# Patient Record
Sex: Female | Born: 1945 | Race: White | Hispanic: No
Health system: Midwestern US, Community
[De-identification: ages and names within clinical notes are randomized; demographics above are authoritative.]

## PROBLEM LIST (undated history)

## (undated) DIAGNOSIS — R011 Cardiac murmur, unspecified: Secondary | ICD-10-CM

## (undated) DIAGNOSIS — E119 Type 2 diabetes mellitus without complications: Secondary | ICD-10-CM

## (undated) DIAGNOSIS — F32A Depression, unspecified: Secondary | ICD-10-CM

## (undated) DIAGNOSIS — I10 Essential (primary) hypertension: Secondary | ICD-10-CM

## (undated) DIAGNOSIS — F419 Anxiety disorder, unspecified: Secondary | ICD-10-CM

## (undated) DIAGNOSIS — F329 Major depressive disorder, single episode, unspecified: Secondary | ICD-10-CM

## (undated) HISTORY — PX: ABDOMINAL HYSTERECTOMY: SHX81

## (undated) HISTORY — PX: BACK SURGERY: SHX140

---

## 2014-01-16 NOTE — Procedures (Signed)
[facility]                         [facility]                      [facility]                              Echocardiogram      Patient Name:     [name], [name]        Date of Service:  12/25/2013  Medical Record #: [id number]            Admission Date:   12/25/2013  Account #:        [id number]           Admitting:  Date of Birth:    February 13, 1946             Attending:  Age:              24                     Hospital Service:  Referring  Physician:  Dictating         Lily Lovings,  Physician:        MD                                   Electronically Authenticated                          Lily Lovings, MD 01/17/2014 09:30 A      [id number] 2D ECHO Complete  TECHNICIAN:  JC  Height:  65 inches      Weight:  190 pounds  Gender:  F        Blood Pressure:  152/80  BSA:  1.94  REASON FOR TEST:     Murmur, abnormal EKG.  PATIENT HISTORY:     Hypertension, diabetes mellitus.  QUALITY OF TEST:     Not given.  CHAMBER DIMENSIONS  LVEDD:      3.6           (up to 5.5)  LVESD:      2.1      (up to 3.5)  V Septum:   1.3      (up to 1.1)  PW LV:   1.3         (up to 1.1)  Ao Root: 2.9  LA diam: 2.8      (up to 4.0)  RVEDD:   3.1      (up to 2.4)  RA area: 14 cm  LA area: 18 cm   (<20)  LAVI:    29 mL/m (22+ 6)  FINDINGS:  Ejection fraction:   69%.  Right ventricle:     Normal.  Left atrium:         Normal.  Right atrium:     Normal.  Aorta:            Normal.  Precardium:    No pericardial effusion.  Aortic valve:     Trace aortic regurgitation.  Mitral valve:              Normal.  Tricuspid valve:      Normal.  Pulmonic valve:  Normal.  IMPRESSION:  1.  Normal left ventricular systolic function.  Ejection fraction  69%.    2.  Mild left ventricular hypertrophy.    3.  Probably trace aortic regurgitation.    4.  Clinical correlation is recommended.  There is no significant  aortic stenosis.                  DOD: 01/16/2014  12:43 P   HK/clm  DOT: 01/16/2014 03:25 P  Job Number:  Document Number: [id number]  cc:

## 2015-04-02 NOTE — Other (Unsigned)
Patient Acct Nbr: [id number]   Primary AUTH/CERT:   Primary Insurance Company Name: [employer]  Primary Insurance Plan name: Medicare A  Primary Insurance Group Number:   Primary Insurance Plan Type: [employer] A  Primary Insurance Policy Number: [id number]    Secondary AUTH/CERT:   Secondary Insurance Company Name: [employer]  Secondary Insurance Plan name: Medicare B  Secondary Insurance Group Number:   Secondary Insurance Plan Type: [employer] B  Secondary Insurance Policy Number: [id number]    Tertiary AUTH/CERT:   [employer]iary Insurance Company Name: [employer]  Tertiary Insurance Plan name: [employer]  Tertiary Insurance Group Number:   [employer]iary Insurance Plan Type: [employer]  Tertiary Insurance Policy Number: [id number]

## 2015-04-11 LAB — FACTOR 5 LEIDEN

## 2015-11-28 ENCOUNTER — Emergency Department: Payer: Medicare (Managed Care)

## 2015-11-28 ENCOUNTER — Emergency Department
Admission: EM | Admit: 2015-11-28 | Discharge: 2015-11-29 | Disposition: A | Discharge status: Home / Self Care | Payer: Medicare (Managed Care) | Attending: Emergency Medicine | Admitting: Emergency Medicine

## 2015-11-28 ENCOUNTER — Encounter: Payer: Self-pay | Admitting: Emergency Medicine

## 2015-11-28 DIAGNOSIS — I10 Essential (primary) hypertension: Secondary | ICD-10-CM | POA: Insufficient documentation

## 2015-11-28 DIAGNOSIS — S42291A Other displaced fracture of upper end of right humerus, initial encounter for closed fracture: Secondary | ICD-10-CM | POA: Insufficient documentation

## 2015-11-28 DIAGNOSIS — W010XXA Fall on same level from slipping, tripping and stumbling without subsequent striking against object, initial encounter: Secondary | ICD-10-CM | POA: Insufficient documentation

## 2015-11-28 DIAGNOSIS — Y999 Unspecified external cause status: Secondary | ICD-10-CM | POA: Insufficient documentation

## 2015-11-28 DIAGNOSIS — Y939 Activity, unspecified: Secondary | ICD-10-CM | POA: Insufficient documentation

## 2015-11-28 DIAGNOSIS — S4290XA Fracture of unspecified shoulder girdle, part unspecified, initial encounter for closed fracture: Secondary | ICD-10-CM

## 2015-11-28 DIAGNOSIS — S0990XA Unspecified injury of head, initial encounter: Secondary | ICD-10-CM | POA: Diagnosis present

## 2015-11-28 DIAGNOSIS — E119 Type 2 diabetes mellitus without complications: Secondary | ICD-10-CM | POA: Insufficient documentation

## 2015-11-28 DIAGNOSIS — Y929 Unspecified place or not applicable: Secondary | ICD-10-CM | POA: Diagnosis not present

## 2015-11-28 DIAGNOSIS — F329 Major depressive disorder, single episode, unspecified: Secondary | ICD-10-CM | POA: Diagnosis not present

## 2015-11-28 HISTORY — DX: Major depressive disorder, single episode, unspecified: F32.9

## 2015-11-28 HISTORY — DX: Type 2 diabetes mellitus without complications: E11.9

## 2015-11-28 HISTORY — DX: Anxiety disorder, unspecified: F41.9

## 2015-11-28 HISTORY — DX: Cardiac murmur, unspecified: R01.1

## 2015-11-28 HISTORY — DX: Depression, unspecified: F32.A

## 2015-11-28 HISTORY — DX: Essential (primary) hypertension: I10

## 2015-11-28 MED ORDER — MORPHINE SULFATE (PF) 4 MG/ML IV SOLN
INTRAVENOUS | Status: AC
  Administered 2015-11-28: 4 mg via INTRAVENOUS
  Filled 2015-11-28: qty 1

## 2015-11-28 MED ORDER — ONDANSETRON HCL 4 MG/2ML IJ SOLN
4.0000 mg | Freq: Once | INTRAMUSCULAR | Status: AC
  Administered 2015-11-28: 4 mg via INTRAVENOUS

## 2015-11-28 MED ORDER — OXYCODONE-ACETAMINOPHEN 5-325 MG PO TABS: 1.0000 | ORAL_TABLET | Freq: Four times a day (QID) | ORAL | Status: AC | PRN

## 2015-11-28 MED ORDER — MORPHINE SULFATE (PF) 4 MG/ML IV SOLN
4.0000 mg | Freq: Once | INTRAVENOUS | Status: AC
  Administered 2015-11-28: 4 mg via INTRAVENOUS

## 2015-11-28 MED ORDER — ONDANSETRON HCL 4 MG/2ML IJ SOLN
INTRAMUSCULAR | Status: AC
  Administered 2015-11-28: 4 mg via INTRAVENOUS
  Filled 2015-11-28: qty 2

## 2015-11-28 NOTE — Discharge Instructions (Signed)
How to Use a Shoulder Immobilizer A shoulder immobilizer is a device that you may have to wear after a shoulder injury or surgery. This device keeps your arm from moving. This prevents additional pain or injury. It also supports your arm next to your body as your shoulder heals. You may need to wear a shoulder immobilizer to treat a broken bone (fracture) in your shoulder. You may also need to wear one if you have an injury that moves your shoulder out of position (dislocation). There are different types of shoulder immobilizers. The one that you get depends on your injury. RISKS AND COMPLICATIONS Wearing a shoulder immobilizer in the wrong way can let your injured shoulder move around too much. This may delay healing and make your pain and swelling worse. HOW TO USE YOUR SHOULDER IMMOBILIZER  The part of the immobilizer that goes around your neck (sling) should support your upper arm, with your elbow bent and your lower arm and hand across your chest.  Make sure that your elbow:  Is snug against the back pocket of the sling.  Does not move away from your body.  The strap of the immobilizer should go over your shoulder and support your arm and hand. Your hand should be slightly higher than your elbow. It should not hang loosely over the edge of the sling.  If the long strap has a pad, place it where it is most comfortable on your neck.  Carefully follow your health care provider's instructions for wearing your shoulder immobilizer. Your health care provider may want you to:  Loosen your immobilizer to straighten your elbow and move your wrist and fingers. You may have to do this several times each day. Ask your health care provider when you should do this and how often.  Remove your immobilizer once every day to shower, but limit the movement in your injured arm. Before putting the immobilizer back on, use a towel to dry the area under your arm completely.  Remove your immobilizer to do  shoulder exercises at home as directed by your health care provider.  Wear your immobilizer while you sleep. You may sleep more comfortably if you have your upper body raised on pillows. SEEK MEDICAL CARE IF:  Your immobilizer is not supporting your arm properly.  Your immobilizer gets damaged.  You have worsening pain or swelling in your shoulder, arm, or hand.  Your shoulder, arm, or hand changes color or temperature.  You lose feeling in your shoulder, arm, or hand.   This information is not intended to replace advice given to you by your health care provider. Make sure you discuss any questions you have with your health care provider.   Document Released: 06/18/2004 Document Revised: 09/25/2014 Document Reviewed: 04/18/2014 Elsevier Interactive Patient Education [employer]2016 Elsevier Inc. Call Dr. Ernest PineHooten in the morning. He should get you an appointment to see him in about a week in the office. Use the Percocet 1-2 pills 4 times a day if needed for pain. If you don't need it don't use it. He cannot drive on the Percocet. Also be careful to make you woozy and constipated. Wear the shoulder immobilizer.

## 2015-11-28 NOTE — ED Provider Notes (Signed)
[facility] Emergency Department Provider Note   ____________________________________________  Time seen: Approximately 10:59 PM  I have reviewed the triage vital signs and the nursing notes.   HISTORY  Chief Complaint Fall    HPI Lawson RadarDoris Banfill is a 70 y.o. female who reports she stepped on a dog and then try not to fall but ended up falling anyway landed on her elbow and shoulder. Also hit her head. She complains of pain in the neck medial scapula area shoulder and little bit of pain in the elbow. She also has some pain in the right CVA area over the ribs. The fall just happened she has no other injuries and she can walk without any difficulty and bend her elbow without any difficulty. Pain is moderately severe.   Past Medical History  Diagnosis Date  . Hypertension   . Diabetes mellitus without complication (HCC)   . Heart murmur   . Mild anxiety   . Depression     There are no active problems to display for this patient.   Past Surgical History  Procedure Laterality Date  . Abdominal hysterectomy    . Back surgery      Current Outpatient Rx  Name  Route  Sig  Dispense  Refill  . oxyCODONE-acetaminophen (ROXICET) 5-325 MG tablet   Oral   Take 1 tablet by mouth every 6 (six) hours as needed.   20 tablet   0     Allergies Eggs or egg-derived products  No family history on file.  Social History Social History  Substance Use Topics  . Smoking status: Never Smoker   . Smokeless tobacco: None  . Alcohol Use: No    Review of Systems Constitutional: No fever/chills Eyes: No visual changes. ENT: No sore throat. Cardiovascular: Denies chest pain. Respiratory: Denies shortness of breath. Gastrointestinal: No abdominal pain.  No nausea, no vomiting.  No diarrhea.  No constipation. Genitourinary: Negative for dysuria. Musculoskeletal: Negative for back pain. Skin: Negative for rash. Neurological: Negative for headaches, focal  weakness or numbness.  10-point ROS otherwise negative.  ____________________________________________   PHYSICAL EXAM:  VITAL SIGNS: ED Triage Vitals  Enc Vitals Group     BP 11/28/15 2145 161/67 mmHg     Pulse Rate 11/28/15 2145 71     Resp 11/28/15 2145 18     Temp 11/28/15 2145 98.2 F (36.8 C)     Temp Source 11/28/15 2145 Oral     SpO2 11/28/15 2145 97 %     Weight 11/28/15 2145 200 lb (90.719 kg)     Height 11/28/15 2145 5\' 5"  (1.651 m)     Head Cir --      Peak Flow --      Pain Score 11/28/15 2159 10     Pain Loc --      Pain Edu? --      Excl. in GC? --     Constitutional: Alert and oriented. Well appearing and inModerate distress Eyes: Conjunctivae are normal. PERRL. EOMI. Head: Atraumatic. Nose: No congestion/rhinnorhea. Mouth/Throat: Mucous membranes are moist.  Oropharynx non-erythematous. Neck: No stridor.  Neck is mildly tender even in the midline Cardiovascular: Normal rate, regular rhythm. Grossly normal heart sounds.  Good peripheral circulation. Respiratory: Normal respiratory effort.  No retractions. Lungs CTAB. Gastrointestinal: Soft and nontender. No distention. No abdominal bruits. Right CVA tenderness. Musculoskeletal: No lower extremity tenderness nor edema.  No joint effusions. Patient has a swollen area in the medial right scapula and  in the deltoid both of these are tender. Patient has good range of motion of the elbow. Neurologic:  Normal speech and language. No gross focal neurologic deficits are appreciated. No gait instability. Skin:  Skin is warm, dry and intact. No rash noted. Psychiatric: Mood and affect are normal. Speech and behavior are normal.  ____________________________________________   LABS (all labs ordered are listed, but only abnormal results are displayed)  Labs Reviewed  URINALYSIS COMPLETEWITH MICROSCOPIC ([facility] ONLY)    ____________________________________________  EKG   ____________________________________________  RADIOLOGY  X-rays shows a fracture of the head of the humerus with minimal displacement. ____________________________________________   PROCEDURES    Procedures    ____________________________________________   INITIAL IMPRESSION / ASSESSMENT AND PLAN / ED COURSE  Pertinent labs & imaging results that were available during my care of the patient were reviewed by me and considered in my medical decision making (see chart for details).   ____________________________________________   FINAL CLINICAL IMPRESSION(S) / ED DIAGNOSES  Final diagnoses:  Fractured shoulder, unspecified laterality, closed, initial encounter      NEW MEDICATIONS STARTED DURING THIS VISIT:  New Prescriptions   OXYCODONE-ACETAMINOPHEN (ROXICET) 5-325 MG TABLET    Take 1 tablet by mouth every 6 (six) hours as needed.     Note:  This document was prepared using Dragon voice recognition software and may include unintentional dictation errors.    Arnaldo NatalPaul F Ledford Goodson, MD 11/29/15 Marlyne Beards0002

## 2015-11-28 NOTE — ED Notes (Signed)
Patient presents to ED with c/o tripping over her dog and fell, pt c/o RIGHT shoulder pain, R hip pain, R knee pain, and R elbow pain. Pt also reports hit the back of head but denies LOC. Pt reports pain increased with movement, states unable to move RIGHT shoulder. Pt reports is on daily 81 mg Aspirin. Pt alert and oriented x 4, speaking in complete sentences, no increased work in breathing noted. Abrasion noted to RIGHT elbow and RIGHT knee.

## 2015-11-29 DIAGNOSIS — S42291A Other displaced fracture of upper end of right humerus, initial encounter for closed fracture: Secondary | ICD-10-CM | POA: Diagnosis not present

## 2015-11-29 MED ORDER — OXYCODONE-ACETAMINOPHEN 5-325 MG PO TABS
1.0000 | ORAL_TABLET | Freq: Once | ORAL | Status: AC
  Administered 2015-11-29: 1 via ORAL
  Filled 2015-11-29: qty 1

## 2016-03-31 NOTE — Other (Unsigned)
Patient Acct Nbr: [id number]   Primary AUTH/CERT:   Primary Insurance Company Name: [employer] [place]  Primary Insurance Plan name: [facility] [employer] Advantage  Primary Insurance Group Number: [id number]  Primary Insurance Plan Type: Health  Primary Insurance Policy Number: [id number]

## 2016-04-02 ENCOUNTER — Inpatient Hospital Stay: Attending: Family | Primary: Family Medicine

## 2016-06-27 ENCOUNTER — Inpatient Hospital Stay
Admit: 2016-06-27 | Discharge: 2016-06-27 | Disposition: A | Discharge status: Other Acute Inpatient Hospital | Admitting: Internal Medicine

## 2016-06-27 DIAGNOSIS — I4891 Unspecified atrial fibrillation: Principal | ICD-10-CM

## 2016-06-27 LAB — CBC WITH AUTO DIFFERENTIAL
Absolute Baso #: 0 10*3/uL (ref 0.0–0.2)
Absolute Eos #: 0.2 10*3/uL (ref 0.0–0.5)
Absolute Lymph #: 2.8 10*3/uL (ref 1.0–4.3)
Absolute Mono #: 0.9 10*3/uL — ABNORMAL HIGH (ref 0.0–0.8)
Absolute Neut #: 4.8 10*3/uL (ref 1.8–7.0)
Basophils: 0.2 % (ref 0.0–2.0)
Eosinophils: 2 % (ref 1.0–6.0)
Granulocytes %: 55.7 % (ref 40.0–80.0)
Hematocrit: 43.5 % (ref 35.0–47.0)
Hemoglobin: 14.5 g/dL (ref 11.7–16.0)
Lymphocyte %: 32 % (ref 20.0–40.0)
MCH: 29.5 pg (ref 26.0–34.0)
MCHC: 33.3 % (ref 32.0–36.0)
MCV: 88.6 fL (ref 79.0–98.0)
MPV: 8.7 fL (ref 7.4–10.4)
Monocytes: 10.1 % — ABNORMAL HIGH (ref 2.0–10.0)
Platelets: 179 10*3/uL (ref 140–440)
RBC: 4.91 10*6/uL (ref 3.80–5.20)
RDW: 14.9 % — ABNORMAL HIGH (ref 11.5–14.5)
WBC: 8.6 10*3/uL (ref 3.6–10.7)

## 2016-06-27 LAB — BASIC METABOLIC PANEL
Anion Gap: 7 NA
BUN: 10 mg/dL (ref 7–25)
CO2: 27 mmol/L (ref 21–32)
Calcium: 8.5 mg/dL (ref 8.2–10.1)
Chloride: 104 mmol/L (ref 98–109)
Creatinine: 0.85 mg/dL (ref 0.55–1.40)
EGFR IF NonAfrican American: >60 mL/min (ref >60)
Glucose: 255 mg/dL — ABNORMAL HIGH (ref 70–100)
Potassium: 3.6 mmol/L (ref 3.5–5.1)
Sodium: 138 mmol/L (ref 135–145)
eGFR African American: >60 mL/min (ref >60)

## 2016-06-27 LAB — BRAIN NATRIURETIC PEPTIDE: NT Pro-BNP: 60 pg/mL (ref 0–125)

## 2016-06-27 LAB — MAGNESIUM: Magnesium: 1.9 mg/dL (ref 1.8–2.4)

## 2016-06-27 LAB — TROPONIN: Troponin I: <0.015 ng/mL (ref 0.000–0.045)

## 2016-06-27 MED ORDER — NORMAL SALINE FLUSH 0.9 % IV SOLN
0.9 % | Freq: Three times a day (TID) | INTRAVENOUS | Status: DC
Start: 2016-06-27 — End: 2016-06-27

## 2016-06-27 MED ORDER — KETOROLAC TROMETHAMINE 30 MG/ML IJ SOLN
30 MG/ML | Freq: Once | INTRAMUSCULAR | Status: AC
Start: 2016-06-27 — End: 2016-06-27
  Administered 2016-06-27: 12:00:00 15 mg via INTRAVENOUS

## 2016-06-27 MED ORDER — DILTIAZEM HCL 25 MG/5ML IV SOLN
25 MG/5ML | Freq: Once | INTRAVENOUS | Status: AC
Start: 2016-06-27 — End: 2016-06-27
  Administered 2016-06-27: 12:00:00 20 mg via INTRAVENOUS

## 2016-06-27 MED ORDER — TETANUS-DIPHTH-ACELL PERTUSSIS 5-2.5-18.5 LF-MCG/0.5 IM SUSP
Freq: Once | INTRAMUSCULAR | Status: AC
Start: 2016-06-27 — End: 2016-06-27
  Administered 2016-06-27: 11:00:00 0.5 mL via INTRAMUSCULAR

## 2016-06-27 MED ORDER — DEXTROSE 5 % IV SOLN
5 % | INTRAVENOUS | Status: DC
Start: 2016-06-27 — End: 2016-06-27
  Administered 2016-06-27: 12:00:00 5 mg/h via INTRAVENOUS

## 2016-06-27 MED FILL — KETOROLAC TROMETHAMINE 30 MG/ML IJ SOLN: 30 MG/ML | INTRAMUSCULAR | Qty: 1

## 2016-06-27 MED FILL — DILTIAZEM HCL 25 MG/5ML IV SOLN: 25 MG/5ML | INTRAVENOUS | Qty: 5

## 2016-06-27 MED FILL — BOOSTRIX 5-2.5-18.5 IM SUSP: INTRAMUSCULAR | Qty: 0.5

## 2016-06-27 MED FILL — DILTIAZEM HCL 125 MG/25ML IV SOLN: 125 MG/25ML | INTRAVENOUS | Qty: 25

## 2016-06-27 NOTE — ED Notes (Signed)
Spoke with Alyssa at [facility] states squad should be arriving any minute now.      Forestine ChuteSteven L Rivers Hamrick, RN  06/27/16 1316

## 2016-06-27 NOTE — ED Notes (Signed)
Normal sinus rhythm verified by ekg. Doctor states to stop cardizem gtt at this time     [name], RN  06/27/16 [phone]

## 2016-06-27 NOTE — ED Provider Notes (Signed)
[facility] ED  eMERGENCY dEPARTMENT eNCOUnter        Pt Name: [name]  MRN: [id number]  Birthdate 19-Feb-1946  Date of evaluation: 06/27/2016  Provider: Blaine Hamper, MD  PCP: Darden Dates, MD  ED Attending: Blaine Hamper, MD        History of Present Illness     Patient Identification  [name] is a 71 y.o. female.    Patient information was obtained from patient.  History/Exam limitations: none.    Chief Complaint   Fall (fell out of bed)      Critical Care Time:  35 minutes excluding separately reported procedures.      History of Present Illness:    71 year old female presents after she fell out of bed sustaining a head injury with scalp laceration to her RIGHT occipital scalp. She is complaining of moderately severe pain at the site of the injury. No neck pain. She denies any loss of consciousness. She does take baby aspirin daily but no other blood thinners. She has no nausea or vomiting. No focal weakness or numbness. On arrival, the patient was found to be in rapid atrial fibrillation. She has no history of previous atrial fibrillation, coronary disease.  She has no history of overactive thyroid, sleep apnea. No use of stimulants. She denies any chest pain shortness of breath or palpitations. She is not lightheaded. Her last tetanus is unknown and will need to be updated. She has no neck or back pain. She denies any extremity injury. After the fall, she was able to get up and walk to the bathroom.    Past Medical History:   Diagnosis Date   ??? Anxiety    ??? Depression    ??? Diabetes mellitus (HCC)    ??? Heart murmur    ??? Hypertension      History reviewed. No pertinent surgical history.  History reviewed. No pertinent family history.  Current Facility-Administered Medications   Medication Dose Route Frequency Provider Last Rate Last Dose   ??? sodium chloride flush 0.9 % injection 3 mL  3 mL Intravenous Q8H Blaine Hamper, MD       ??? diltiazem 125 mg in dextrose 5 % 125 mL infusion   5 mg/hr Intravenous Continuous Blaine Hamper, MD 5 mL/hr at 06/27/16 0645 5 mg/hr at 06/27/16 0645     Current Outpatient Prescriptions   Medication Sig Dispense Refill   ??? insulin glargine (LANTUS) 100 UNIT/ML injection vial Inject into the skin nightly     ??? gabapentin (NEURONTIN) 100 MG capsule Take 100 mg by mouth 3 times daily.     ??? insulin lispro (HUMALOG) 100 UNIT/ML injection vial Inject into the skin 3 times daily (before meals)     ??? glimepiride (AMARYL) 2 MG tablet Take 2 mg by mouth every morning (before breakfast)     ??? SITagliptin (JANUVIA) 100 MG tablet Take 50 mg by mouth daily     ??? lovastatin (MEVACOR) 20 MG tablet Take 20 mg by mouth nightly     ??? levothyroxine (SYNTHROID) 112 MCG tablet Take 112 mcg by mouth Daily     ??? amLODIPine (NORVASC) 5 MG tablet Take 5 mg by mouth daily     ??? sertraline (ZOLOFT) 100 MG tablet Take 100 mg by mouth daily     ??? cyclobenzaprine (FLEXERIL) 10 MG tablet Take 10 mg by mouth 3 times daily as needed for Muscle spasms       No Known  Allergies  Social History     Social History   ??? Marital status: Divorced     Spouse name: N/A   ??? Number of children: N/A   ??? Years of education: N/A     Occupational History   ??? Not on file.     Social History Main Topics   ??? Smoking status: Never Smoker   ??? Smokeless tobacco: Never Used   ??? Alcohol use No   ??? Drug use: No   ??? Sexual activity: Not on file     Other Topics Concern   ??? Not on file     Social History Narrative   ??? No narrative on file         Review of Systems  Denies fever, neck pain, chest pain, short of breath, cough, abdominal pain, urinary symptoms.  Remainder of systems reviewed and negative.      Physical Exam     Vitals:    Vitals:    06/27/16 0603   BP: (!) 184/102   Pulse: 144   Resp: 20   Temp: 98.8 ??F (37.1 ??C)   TempSrc: Oral   SpO2: 98%     Nursing notes and vitals signs reviewed and I agree.    General Appearance:    Alert, cooperative, Mild painful but no respiratory distress, appears stated age.    Head:    Normocephalic. There is a 2 cm laceration to the RIGHT occipital scalp. No bony crepitus or step-off. No significant hematoma. No Battle's or raccoon sign.    Eyes:    PERRL, conjunctiva/corneas clear, EOM's intact.  Sclera anicteric.  No hyphema or subconjunctival hemorrhage.    ENT:   Mucous membranes moist.     Neck:   Supple, symmetrical, trachea midline, no adenopathy.  No jugular venous distention.  There is no cervical thoracic or lumbar spine tenderness. Full range of motion.    Lungs:     Clear to auscultation bilaterally, respirations unlabored.  No rales, rhonchi or wheezes.   Chest Wall:    No tenderness.    Heart:    Tachycardic with an irregularly irregular rhythm, S1 and S2 normal, no murmur, rub or gallop.   Abdomen:     Soft, non-tender, bowel sounds active all four quadrants,     no masses, no organomegaly.  No cva tenderness.     Extremities:   No edema, cords or calf tenderness.  Full range of motion.  There is no hip or pelvic tenderness. The hips and pelvis are stable on compression and rocking. No extremity tenderness.    Pulses:   2+ and symmetric all extremities.   Skin:   Turgor is normal, no rashes or lesions.   Neurologic:   Alert and oriented X 3.  No focal findings. GCS is 15. Cranial nerves II through IV are intact. Motor strength is normal and equal in all extremities. Sensation is intact to light touch. Reflexes revealed intact knee jerk responses. Gait was not tested but there is no ataxia.        ED Course     Medical Decision Making:    Differential diagnosis includes Head injury with scalp laceration on aspirin, consider concussion subdural or epidural hematoma. Sitter potential for spinal injury. There is no evidence for spinal cord injury. She is in rapid atrial fibrillation. Consider ischemic heart disease, valvular heart disease, electrolyte disturbance. Treatment options include rate control or cardioversion.    DIAGNOSTIC RESULTS     EKG:  interpreted by me in the  absence of a real-time interpretation by the cardiologist.  Atrial relation with rapid ventricular response at a rate of 145, LEFT axis deviation, normal intervals, LEFT ventricular hypertrophy with repolarization changes, nonspecific ST-T wave changes. Likely rate related ST depressions in the lateral leads versus LVH related changes. No ventricular ectopy.    RADIOLOGY   Interpretation per the Radiologist below, and visualized by me:  Xr Chest Portable    Result Date: 06/27/2016  Patient Name:  KERRY-ANNE, MEZO MRN:  [id number] FIN:  [id number] ---Diagnostic Radiology--- Exam Date/Time        06/27/2016 06:35:00 EST                             Exam                  CR Chest Portable                                   Ordering Physician    Hulda Marin, MD, Yamaris Cummings B                             Accession Number      [id number]                                       CPT4 Codes 57846 () Reason For Exam rapid afib, fall Report PORTABLE CHEST CLINICAL INDICATION:  Fall. Atrial fibrillation. COMPARISON: 08/09/2006 TECHNIQUE: Portable upright AP chest.  FINDINGS: The cardiac and mediastinal silhouette is normal. Coarse bilateral interstitial markings. No focal consolidation, pneumothorax or pleural effusion. The hila are normal. Thoracic degenerative spondylosis. IMPRESSION:  No acute cardiopulmonary abnormality. Report Dictated on Workstation: [id number] ---** Final ---** Dictating Physician: Shane Crutch DO, DAVID I Signed Date and Time: 06/27/2016 7:02 am Signed by: Shane Crutch DO, DAVID I Transcribed Date and Time: 06/27/2016 7:03    CT scan of head and cervical spine interpreted by me in the absence of a radiologist, pending their official report: There is no acute intracranial bleed, no mass or mass effect or midline shift. There is a stapled scalp wound present. No hematoma. Cervical spine CT shows no acute fracture or dislocation. No soft tissue swelling. Degenerative changes are noted.      LABS:  Labs Reviewed   BASIC  METABOLIC PANEL - Abnormal; Notable for the following:        Result Value    Glucose 255 (*)     All other components within normal limits    Narrative:     Test Performed by [facility], [address],  [place], [place] [address]   CBC WITH AUTO DIFFERENTIAL - Abnormal; Notable for the following:     RDW 14.9 (*)     Monocytes 10.1 (*)     Absolute Mono # 0.9 (*)     All other components within normal limits    Narrative:     Test Performed by [facility], [address]  [place], [place] [address]   BRAIN NATRIURETIC PEPTIDE    Narrative:     Test Performed by [facility], [address]  [place], [place] [address]   MAGNESIUM    Narrative:  Test Performed by [facility], [address],  [place], [place] [id number]   TROPONIN    Narrative:     Test Performed by [facility], [address],  [place], [place] [id number]          Procedures:  Wound cleansed with tap water and then scrubbed. The wound length is actually 2.7 cm. Wound was closed with skin staples ?? 5 with good closure obtained. Did not need any local anesthesia. She tolerated this well.      Vitals:    Vitals:    06/27/16 0603   BP: (!) 184/102   Pulse: 144   Resp: 20   Temp: 98.8 ??F (37.1 ??C)   TempSrc: Oral   SpO2: 98%       Treatments:  On arrival the patient is hemodynamically stable.  However she is in rapid atrial fibrillation. She is hypertensive on arrival. Cardizem 20 mg bolus followed by 5 mg per hour drip initiated. Tetanus status updated.  Toradol IV given for pain.  6:40 AM: After Cardizem bolus, heart rate is under good control with rate between 95 and 110.  Labs showed no major abnormalities. Troponin is negative and BMP is normal. Blood sugar is elevated at 255. No other electrolyte abnormalities noted.    Medications   sodium chloride flush 0.9 % injection 3 mL (not administered)   diltiazem 125 mg in dextrose 5 % 125 mL infusion (5 mg/hr Intravenous New Bag 06/27/16 0645)   Tetanus-Diphth-Acell  Pertussis (BOOSTRIX) injection 0.5 mL (0.5 mLs Intramuscular Given 06/27/16 0628)   diltiazem injection 20 mg (20 mg Intravenous Given 06/27/16 0631)   ketorolac (TORADOL) injection 15 mg (15 mg Intravenous Given 06/27/16 0705)       MDM  Number of Diagnoses or Management Options  Atrial fibrillation with rapid ventricular response (HCC):   Fall from bed, initial encounter:   Laceration of scalp, initial encounter:   .        DISPOSITION/PLAN   Disposition:  Admitted.      FINAL IMPRESSION      1. Atrial fibrillation with rapid ventricular response (HCC)    2. Laceration of scalp, initial encounter    3. Fall from bed, initial encounter            Blaine HamperJeffrey B Randee Huston, MD (electronically signed)  Emergency Medicine Provider        Please note that this chart was generated using dragon dictation software.  Although every effort was made to ensure the accuracy of this automated transcription, some errors in transcription may have occurred.       Blaine HamperJeffrey B Rupa Lagan, MD  06/27/16 [phone]

## 2016-06-27 NOTE — ED Notes (Signed)
[facility] states eta is now [address]     Forestine Chute, RN  06/27/16 1217

## 2016-06-27 NOTE — ED Notes (Signed)
Cleaned wound. Dr at bedside to put staples in.     Myrtie Hawkenee E Kamin Niblack, RN  06/27/16 [phone]

## 2016-06-27 NOTE — ED Notes (Signed)
Pt requesting pain meds. Dr informed.     Myrtie Hawkenee E Sweden Lesure, RN  06/27/16 [phone]

## 2016-06-27 NOTE — ED Notes (Signed)
[facility], [facility] called for transport, ETA reported to be 1045.     Bonnita Hollowebra L Tekesha Almgren, RN  06/27/16 1014

## 2016-06-27 NOTE — ED Notes (Addendum)
Pt presented to Ed by EMS from a fall. Pt fell out of bed and hit her head on her night stand. Pt has a small laceration to the back of the right side of head. Pt is A/O X3.  Pt has easy unlabored respirations. Pt has equal and reactive pupils. Pt has a black and blue discoloration to right side of eye. Pt is moving all extremities. Pt has normal active bowel sounds in all 4 quads. Pt ambulatory. Pt is resting in bed.     Myrtie Hawk, RN  06/27/16 1610       Myrtie Hawk, RN  06/27/16 [phone]

## 2016-06-27 NOTE — ED Notes (Signed)
Spoke with supervisor Deretha Emoryhambers at [facility], assures me they will send squad ASAP     Forestine ChuteSteven L Kortlynn Poust, RN  06/27/16 1316

## 2016-06-27 NOTE — ED Provider Notes (Signed)
I received signout from Dr. Verne Spurr in regards to the patient at approximately at approximate 720 a.m.  Plan was for the patient to be admitted to this hospital for atrial fibrillation patient was on a Cardizem drip.  Patient does not have a history of atrial fibrillation that we are aware of.  Spoke with care manager Wilford Corner at 819 a.m.  due to patient's insurance of medical mutual transferred to [facility]. Recommended.  I initially spoke with transfer center at [facility]. Allie 842 a.m. who took report on the patient.  Patient requested to go to [facility]. however [facility] not [facility] any beds.  Patient is aware comfortable with transport to [facility].  Patient did have improvement of heart rate and repeat electrocardiogram shows a sinus rhythm at 81 with no acute ST-T elevation.  Patient was accepted by Dr. Hyacinth Meeker.  Patient will be transferred to [facility]. for further workup and treatment of new onset paroxysmal atrial fibrillation.     Jobe Igo, MD  06/27/16 1052

## 2016-06-27 NOTE — Other (Unsigned)
Patient Acct Nbr: [id number]   Primary AUTH/CERT:   Primary Insurance Company Name: [employer]  Primary Insurance Plan name: [facility] Advantage  Primary Insurance Group Number: [id number]  Primary Insurance Plan Type: Health  Primary Insurance Policy Number: [id number]    Secondary AUTH/CERT:   Secondary Insurance Company Name: [facility]  Secondary Insurance Plan name: Medicaid  Secondary Insurance Group Number:   Secondary Insurance Plan Type: Health  Secondary Insurance Policy Number: [id number]

## 2016-06-27 NOTE — ED Notes (Signed)
Bed control reports [facility] open [facility] at [facility], pt states  " I guess well have [facility]". Dr. Everlena Cooperadford aware     Julian Reilebecca Janera Peugh, RN  06/27/16 [phone]

## 2016-06-27 NOTE — ED Notes (Signed)
Effort of standing and pivoting to use BSC, HR noted to increase to 131, patient denies SOB, CP.     Bonnita Hollow, RN  06/27/16 [phone]

## 2016-06-27 NOTE — ED Notes (Signed)
Pt and family requesting admit to [facility], states Dr., Warnell Bureau in with Dr, Dutch Quint and Dr. Dutch Quint sees pts at [facility]. Dr. Everlena Cooper notified of pt and family request.      [name], RN  06/27/16 [phone]

## 2016-06-27 NOTE — Care Coordination-Inpatient (Addendum)
2/3   70 YR OLD WF, DIVORCED, LIVES ALONE, S/P FALL, LACERATION HEAD, FOUND TO BE IN AFIB/RVR, NEW ONSET.  PT IS OUT OF NETWORK, HAS [employer] MEDICARE ADVANTAGE.  TALKED TO ER PHYSICIAN AND TOLD HIM HE HAS TO CALL [facility] TO SEE IF THERE IS A BED AVAILABLE AND IF SO NEEDS TRANSFERRED TO THAT FACILITY.  MMISHLER RN X [phone]    2/3    FAMILY HAS CHOSEN [facility] THAN [facility], [facility] IS IN NETWORK W. [employer] MEDICARE ADVANTAGE.  ER ARRANGING TRANSFER  MMISHLER RN X [phone]

## 2016-06-27 NOTE — ED Notes (Signed)
[facility] called to follow up on patient transfer time, advised that transport now scheduled for noon. Patient updated on change.     Bonnita Hollowebra L Joycelin Radloff, RN  06/27/16 1105

## 2016-06-27 NOTE — ED Notes (Signed)
Pt and family aware of bed assignment now [facility]     Julian ReilRebecca Shalonda Sachse, [place]  06/27/16 [phone]

## 2016-06-27 NOTE — ED Notes (Signed)
Noted normal sinus rhythm on monitor, Dr. Eather ColasAware , will repeat ekg to verify, If back in nsr dr. Andrey CotaStates to d/c cardizem gtt     Julian Reilebecca Delmos Velaquez, RN  06/27/16 0930

## 2016-07-23 ENCOUNTER — Encounter: Attending: Cardiovascular Disease | Primary: Family Medicine

## 2017-02-04 IMAGING — CT CT CERVICAL SPINE W/O CM
4 of 7 series · 13 of 33 positions shown, 14 images · non-contrast
Comparison: None.

CLINICAL DATA: Tripped over dog and fell tonight striking the back
of the head.

EXAM:
CT HEAD WITHOUT CONTRAST
CT CERVICAL SPINE WITHOUT CONTRAST
TECHNIQUE: Multidetector CT imaging of the head and cervical spine was
performed following the standard protocol without intravenous
contrast. Multiplanar CT image reconstructions of the cervical spine
were also generated.

[Series 5: soft tissue · axial · 0.37mm/px · z∈[-322,-214]mm · 4 of 91 slices shown]
[im 19/91  soft-tissue]
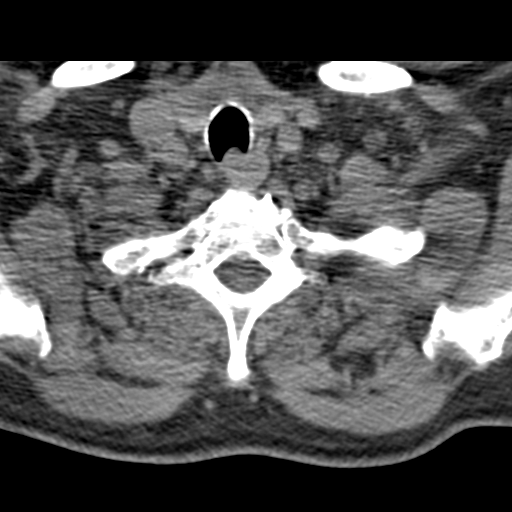
[im 37/91  soft-tissue]
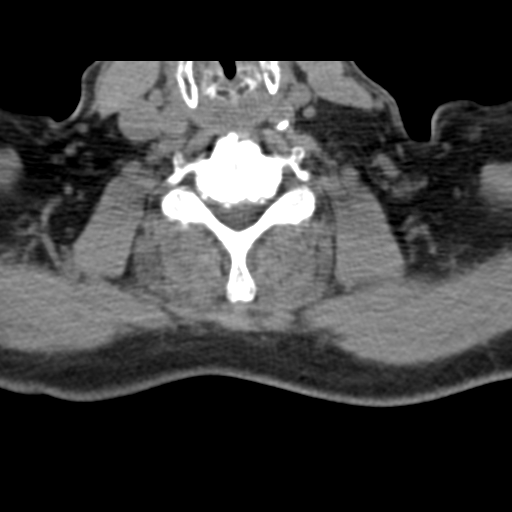
[im 55/91  soft-tissue]
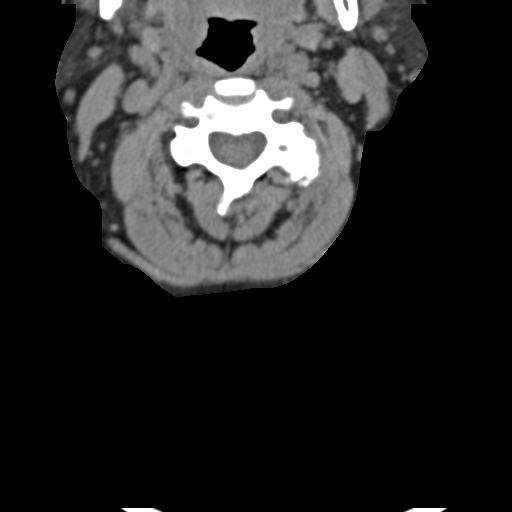
[im 73/91  soft-tissue]
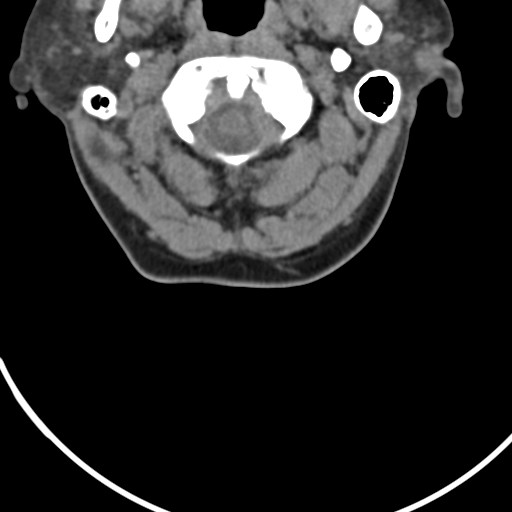

[Series 8: sagittal bone · sagittal · 0.28mm/px · 4 of 41 slices shown]
[im 9/41  bone]
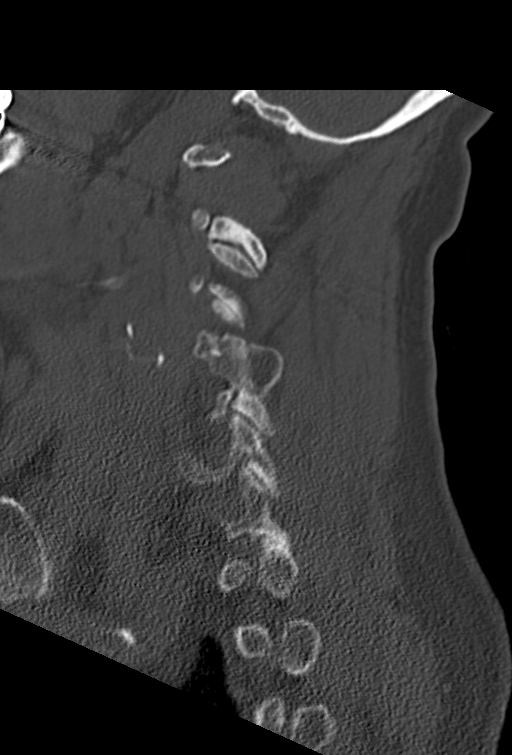
[im 17/41  bone]
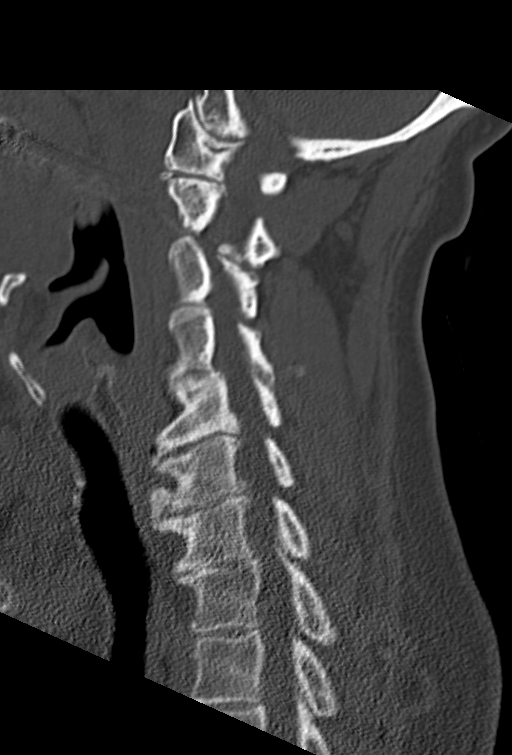
[im 25/41  bone]
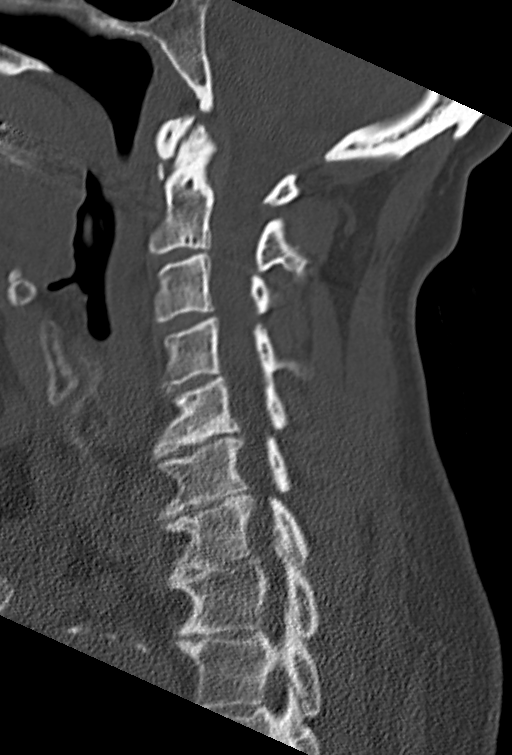
[im 33/41  bone]
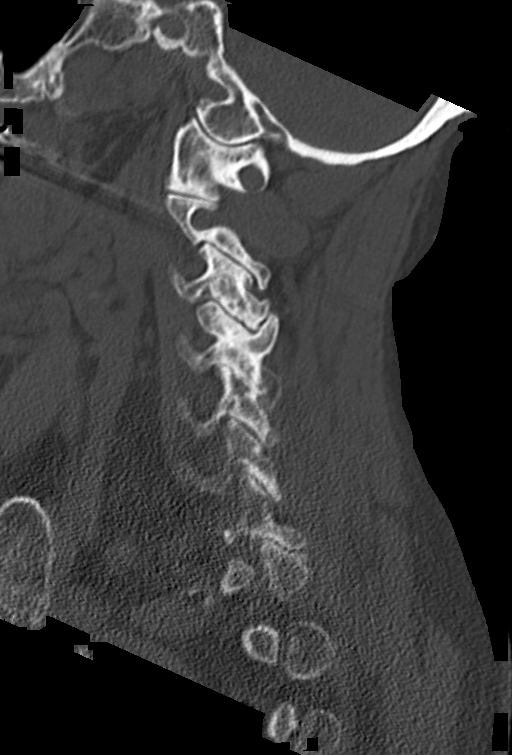

[Series 9: coronal bone · coronal · 0.23mm/px · 1 of 49 slices shown]
[im 25/49  bone]
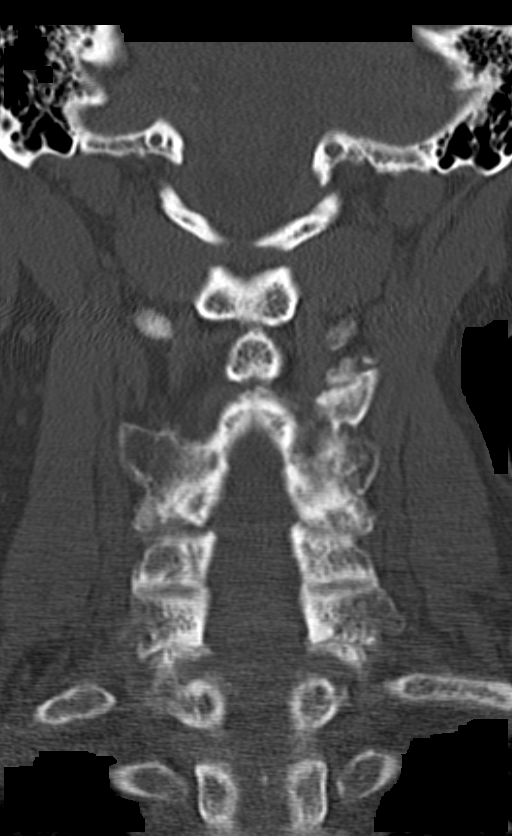

[Series 10: axial · axial · 0.31mm/px · z∈[-353,-244]mm · 4 of 102 slices shown, 5 images]
[im 21/102  soft-tissue]
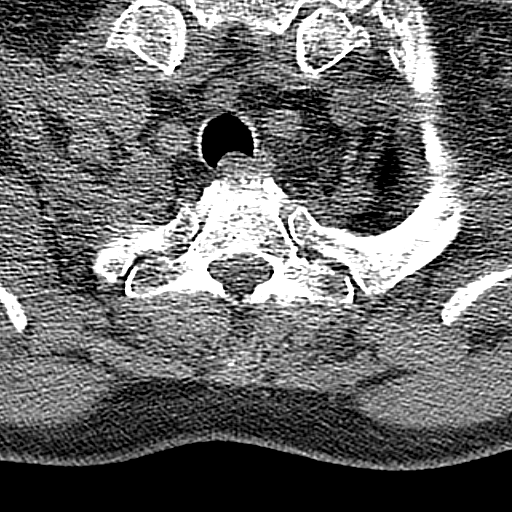
[im 21/102  bone]
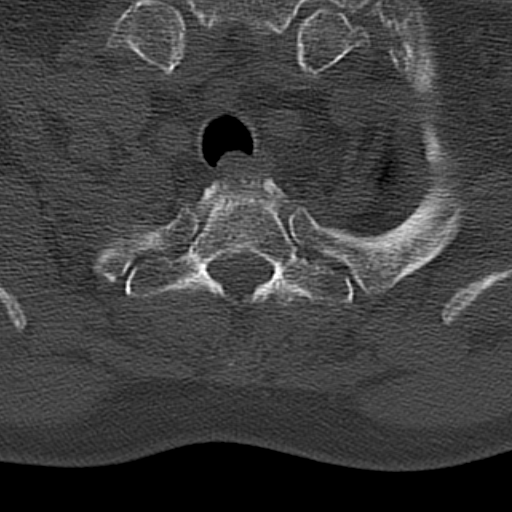
[im 41/102  bone]
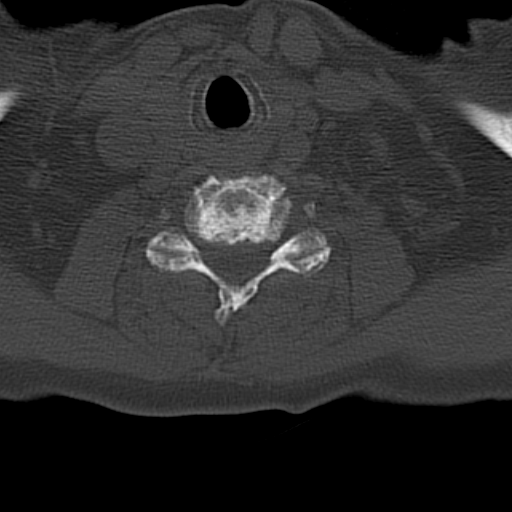
[im 61/102  bone]
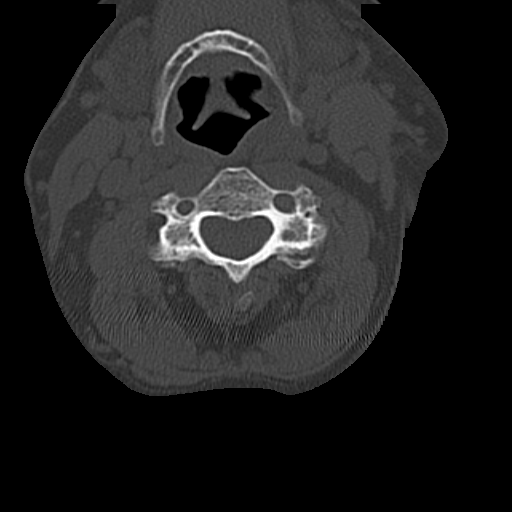
[im 81/102  bone]
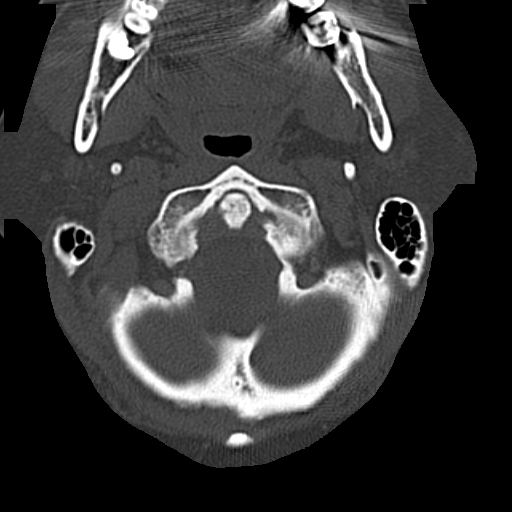

[13 of 33 positions shown; findings below may reference images not displayed]

FINDINGS: CT HEAD FINDINGS

The brain has a normal appearance without evidence of malformation,
atrophy, old or acute infarction, mass lesion, hemorrhage,
hydrocephalus or extra-axial collection. The calvarium is
unremarkable. The paranasal sinuses, middle ears and mastoids are
clear.

CT CERVICAL SPINE FINDINGS

No fracture or malalignment. No soft tissue swelling. There is facet
arthropathy on the right at C2-3 and C3-4. There is facet fusion at
C4-5. Mild facet degeneration also at C7-T1. On the left, there are
similar findings with degeneration at C2-3, C3-4 and C7-T1 and
fusion at C4-5. Degenerative spondylosis at C5-6 and C6-7 with
endplate osteophytes and foraminal encroachment due to osteophytes.
IMPRESSION: Head CT:  Normal.

Cervical spine CT: No acute or traumatic finding. Chronic
degenerative facet arthropathy and degenerative spondylosis.

## 2019-05-09 ENCOUNTER — Inpatient Hospital Stay: Attending: Family Medicine | Primary: Family Medicine

## 2019-05-09 NOTE — Other (Unsigned)
Patient Acct Nbr: [id number]   Primary AUTH/CERT:   Primary Insurance Company Name: Company secretary Plan name: [employer] [facility] Medicare  Primary Insurance Group Number: [id number]  Primary Insurance Plan Type: Health  Primary Insurance Policy Number: [id number]

## 2019-06-12 ENCOUNTER — Ambulatory Visit: Admit: 2019-06-12 | Discharge: 2019-06-12 | Payer: MEDICARE | Attending: Vascular Surgery | Primary: Family Medicine

## 2019-06-12 DIAGNOSIS — I779 Disorder of arteries and arterioles, unspecified: Secondary | ICD-10-CM

## 2019-08-01 ENCOUNTER — Inpatient Hospital Stay: Admit: 2019-08-01 | Attending: Family Medicine | Primary: Family Medicine

## 2019-08-01 DIAGNOSIS — R0600 Dyspnea, unspecified: Secondary | ICD-10-CM

## 2019-08-01 LAB — NM MYOCARDIAL SPECT REST EXERCISE OR RX: Left Ventricular Ejection Fraction: 74

## 2019-08-01 MED ORDER — REGADENOSON 0.4 MG/5ML IV SOLN
0.4 MG/5ML | Freq: Once | INTRAVENOUS | Status: AC | PRN
Start: 2019-08-01 — End: 2019-08-01
  Administered 2019-08-01: 19:00:00 0.4 mg via INTRAVENOUS

## 2019-08-01 MED ORDER — SODIUM CHLORIDE 0.9 % IV SOLN
0.9 % | INTRAVENOUS | Status: AC | PRN
Start: 2019-08-01 — End: 2019-08-01

## 2019-08-01 MED ORDER — ATROPINE SULFATE 1 MG/10ML IJ SOSY
1 MG/0ML | INTRAMUSCULAR | Status: AC | PRN
Start: 2019-08-01 — End: 2019-08-01

## 2019-08-01 MED ORDER — AMINOPHYLLINE 25 MG/ML IV SOLN
25 MG/ML | INTRAVENOUS | Status: AC | PRN
Start: 2019-08-01 — End: 2019-08-01

## 2019-08-01 MED ORDER — NORMAL SALINE FLUSH 0.9 % IV SOLN
0.9 % | INTRAVENOUS | Status: AC | PRN
Start: 2019-08-01 — End: 2019-08-01
  Administered 2019-08-01: 19:00:00 10 mL via INTRAVENOUS

## 2019-08-01 MED ORDER — ALBUTEROL SULFATE HFA 108 (90 BASE) MCG/ACT IN AERS
108 (90 Base) MCG/ACT | RESPIRATORY_TRACT | Status: AC | PRN
Start: 2019-08-01 — End: 2019-08-01

## 2019-08-01 MED ORDER — METOPROLOL TARTRATE 5 MG/5ML IV SOLN
5 MG/ML | INTRAVENOUS | Status: AC | PRN
Start: 2019-08-01 — End: 2019-08-01

## 2019-08-01 MED ORDER — NITROGLYCERIN 0.4 MG SL SUBL
0.4 MG | SUBLINGUAL | Status: AC | PRN
Start: 2019-08-01 — End: 2019-08-01

## 2019-08-01 NOTE — Patient Instructions (Signed)
Left message with patient regarding pre procedure instructions for nuclear stress test on 08-01-2019 at 1245, including no caffeine products for 12 hours prior to test.  Call back number provided for any questions.

## 2019-08-15 ENCOUNTER — Inpatient Hospital Stay: Admit: 2019-08-15 | Attending: Family Medicine | Primary: Family Medicine

## 2020-07-10 LAB — SURGICAL PATHOLOGY

## 2020-07-22 NOTE — Telephone Encounter (Signed)
Spoke with patient. Reviewed instructions for procedure.  Pt aware of arrival time at 6:30 am to [facility] at [facility] on 07/24/20. Come in the main entrance and proceed to the [place] elevators. Take the [facility] elevator up to 2R and this is where the [facility] department is located. Nothing to eat after midnight, you may drink clear liquids (water, black coffee, clear tea, apple juice, cranberry juice and/or carbonated beverages) and take medications up until 6 am and you will need a responsible person to drive you home after procedure. Pt takes Eliquis and may continue to take prior to procedure. Questions answered. Pt verbalized understanding.

## 2020-07-24 ENCOUNTER — Encounter: Admit: 2020-07-24 | Primary: Family Medicine

## 2020-07-24 LAB — MANUAL DIFFERENTIAL
Absolute Baso #: 6 10*3/uL — ABNORMAL HIGH (ref 0.0–0.2)
Absolute Eos #: 1 10*3/uL — ABNORMAL HIGH (ref 0.0–0.5)
Absolute Lymph #: 6 10*3/uL — ABNORMAL HIGH (ref 1.1–4.5)
Absolute Mono #: 4 10*3/uL — ABNORMAL HIGH (ref 0.2–1.1)
Absolute Neut #: 68.2 10*3/uL — ABNORMAL HIGH (ref 2.2–8.2)
Bands: 20 % — ABNORMAL HIGH (ref 0–3)
Basophils: 6 % — ABNORMAL HIGH (ref 0–2)
Blasts: 1 % — AB (ref <1)
Eosinophils: 1 % (ref 1–6)
Lymphocytes: 6 % — ABNORMAL LOW (ref 20–40)
Metamyelocytes: 5 % — AB (ref <1)
Monocytes: 4 % (ref 2–10)
Myelocytes: 6 % — AB (ref <1)
Promyelocytes: 3 % — AB (ref <1)
RBC Morphology: ABNORMAL NA
Seg Neutrophils: 48 % (ref 40–80)
TOTAL CELLS COUNTED: 100 NA
nRBC: 1 /100{WBCs} — ABNORMAL HIGH (ref <=0)

## 2020-07-24 LAB — CBC WITH AUTO DIFFERENTIAL
Hematocrit: 36.4 % (ref 35.0–47.0)
Hemoglobin: 11.5 g/dL — ABNORMAL LOW (ref 11.7–16.0)
MCH: 28.7 pg (ref 26.0–34.0)
MCHC: 31.6 % — ABNORMAL LOW (ref 32.0–36.0)
MCV: 90.8 fL (ref 79.0–98.0)
MPV: 9.7 fL (ref 7.4–10.4)
Platelets: 201 10*3/uL (ref 140–440)
RBC: 4.01 10*6/uL (ref 3.80–5.20)
RDW: 18.6 % — ABNORMAL HIGH (ref 11.5–14.5)
WBC: 100.3 10*3/uL (ref 3.6–10.7)

## 2020-07-24 LAB — PROTIME-INR
INR: 1.2 NA — ABNORMAL HIGH (ref 0.9–1.1)
Protime: 13 s — ABNORMAL HIGH (ref 9.0–12.0)

## 2020-07-24 MED ORDER — MIDAZOLAM HCL 2 MG/2ML IJ SOLN
2 | INTRAMUSCULAR | Status: AC
Start: 2020-07-24 — End: 2020-07-24

## 2020-07-24 MED ORDER — LIDOCAINE HCL (PF) 2 % IJ SOLN
2 | INTRAMUSCULAR | Status: AC
Start: 2020-07-24 — End: 2020-07-24

## 2020-07-24 MED ORDER — SODIUM CHLORIDE 0.9 % IV SOLN
0.9 % | INTRAVENOUS | Status: DC
Start: 2020-07-24 — End: 2020-07-25

## 2020-07-24 MED ORDER — MIDAZOLAM HCL 2 MG/2ML IJ SOLN
2 MG/ML | Freq: Once | INTRAMUSCULAR | Status: AC | PRN
Start: 2020-07-24 — End: 2020-07-24
  Administered 2020-07-24: 14:00:00 via INTRAVENOUS

## 2020-07-24 MED ORDER — FENTANYL CITRATE (PF) 100 MCG/2ML IJ SOLN
100 | INTRAMUSCULAR | Status: AC
Start: 2020-07-24 — End: 2020-07-24

## 2020-07-24 MED ORDER — ACETAMINOPHEN 325 MG PO TABS
325 MG | ORAL | Status: DC | PRN
Start: 2020-07-24 — End: 2020-07-25

## 2020-07-24 MED ORDER — LIDOCAINE HCL (PF) 1 % IJ SOLN
1 % | Freq: Once | INTRAMUSCULAR | Status: AC | PRN
Start: 2020-07-24 — End: 2020-07-24
  Administered 2020-07-24: 14:00:00 via INTRADERMAL

## 2020-07-24 MED ORDER — FENTANYL CITRATE (PF) 100 MCG/2ML IJ SOLN
100 MCG/2ML | Freq: Once | INTRAMUSCULAR | Status: AC | PRN
Start: 2020-07-24 — End: 2020-07-24
  Administered 2020-07-24: 14:00:00 50 via INTRAVENOUS

## 2020-07-24 MED FILL — MIDAZOLAM HCL 2 MG/2ML IJ SOLN: 2 mg/mL | INTRAMUSCULAR | Qty: 2

## 2020-07-24 MED FILL — FENTANYL CITRATE (PF) 100 MCG/2ML IJ SOLN: 100 MCG/2ML | INTRAMUSCULAR | Qty: 2

## 2020-07-24 MED FILL — XYLOCAINE-MPF 2 % IJ SOLN: 2 % | INTRAMUSCULAR | Qty: 10

## 2020-07-24 NOTE — Other (Signed)
Patient arrived from [facility], Dr. Monte Fantasia in to speak with the patient regarding ct bone biopsy, consent obtained. Patient was placed prone on exam table prepped and draped in sterile fashion. Telemetry monitors placed, conscious sedation administered. Patient tolerated procedure well. Transfer to SDS

## 2020-07-24 NOTE — Other (Unsigned)
Patient Acct Nbr: [id number]   Primary AUTH/CERT:   Primary Insurance Company Name: Company secretary Plan name: [employer] [facility] Medicare  Primary Insurance Group Number: [id number]  Primary Insurance Plan Type: Health  Primary Insurance Policy Number: [id number]

## 2020-07-24 NOTE — H&P (Signed)
[facility]   Comprehensive PreProcedure History and Physical      Name: Renie Stelmach MRN: [id number] DOB: 09/24/1945    (Age-75 y.o.)     Date of Service: Pt seen/examined on 07/24/2020     Chief Complaint:  75 y.o. female who we are asked to see/evaluate Beverely Pace for pre-procedure evaluation prior to CT BIOPSY BONE IVR.    History Of Present Illness:      HPI:  This pleasant 75 yo woman has a hx of abnormal WBC's and bruising. She now presents for Bone Marrow Bx. Severity: Moderate Duration: >one month         Review of systems negative except for items below:  Past Medical History:   Diagnosis Date   ??? Anxiety    ??? Depression    ??? Diabetes mellitus (HCC)    ??? Heart murmur    ??? Hyperlipidemia    ??? Hypertension    ??? Thyroid disease      Patient Active Problem List    Diagnosis Date Noted   ??? Rapid atrial fibrillation (HCC) 06/27/2016         Past Surgical History:   Procedure Laterality Date   ??? BACK SURGERY     ??? HYSTERECTOMY           Family History   Problem Relation Age of Onset   ??? Stroke Mother    ??? Diabetes Father    ??? Heart Disease Father          Medications:   CURRENT MEDS W/ ASSOC DIAG     Med List Status: Complete Set By: Beryle Beams, RN at 07/24/2020  6:59 AM            Start Date End Date     amLODIPine (NORVASC) 5 MG tablet  --  --     Associated Diagnoses:  --     apixaban (ELIQUIS) 5 MG TABS tablet  --  --     Associated Diagnoses:  --     atorvastatin (LIPITOR) 80 MG tablet  04/22/19  --     Associated Diagnoses:  --     cyclobenzaprine (FLEXERIL) 10 MG tablet  --  --     Associated Diagnoses:  --     fluticasone (FLONASE) 50 MCG/ACT nasal spray  05/02/19  --     Associated Diagnoses:  --     gabapentin (NEURONTIN) 100 MG capsule  --  --     Associated Diagnoses:  --     glimepiride (AMARYL) 2 MG tablet  --  --     Associated Diagnoses:  --     insulin lispro (HUMALOG) 100 UNIT/ML injection vial  --  --     Associated Diagnoses:  --     levothyroxine (SYNTHROID) 112 MCG  tablet  --  --     Associated Diagnoses:  --     lisinopril (PRINIVIL;ZESTRIL) 20 MG tablet  --  --     Associated Diagnoses:  --     metoprolol tartrate (LOPRESSOR) 25 MG tablet  --  --     Associated Diagnoses:  --     Semaglutide,0.25 or 0.5MG /DOS, (OZEMPIC, 0.25 OR 0.5 MG/DOSE,) 2 MG/1.5ML SOPN  --  --     Associated Diagnoses:  --     sertraline (ZOLOFT) 100 MG tablet  --  --     Associated Diagnoses:  --     SITagliptin (JANUVIA) 100 MG tablet  --  --  Associated Diagnoses:  --     TRESIBA FLEXTOUCH 100 UNIT/ML SOPN  04/25/19  --     Associated Diagnoses:  --             Flag for Provider Review         Start Date End Date     insulin glargine (LANTUS) 100 UNIT/ML injection vial  --  --     Associated Diagnoses:  --          Social history and Laboratory Data:    Lab Results   Component Value Date/Time    HGB 14.5 06/27/2016 06:24 AM    HCT 43.5 06/27/2016 06:24 AM    PLT 179 06/27/2016 06:24 AM    WBC 8.6 06/27/2016 06:24 AM    NA 138 06/27/2016 06:24 AM    K 3.6 06/27/2016 06:24 AM    BUN 10 06/27/2016 06:24 AM    CREATININE 0.85 06/27/2016 06:24 AM    GLUCOSE 255 (H) 06/27/2016 06:24 AM    Social History     Tobacco Use   Smoking Status Never Smoker   Smokeless Tobacco Never Used      Social History     Substance and Sexual Activity   Alcohol Use No     Social History     Substance and Sexual Activity   Drug Use No        EKG: Reviewed       No results for input(s): GLUCOSE in the last 72 hours. No results found for this or any previous visit.             BP (!) 165/67    Pulse 67    Temp 98.9 ??F (37.2 ??C) (Temporal)    Resp 16    SpO2 97%         Physical Examination:  onstitutional: No apparent distress, well nourished and in stable condition. Pt wearing a mask and I wore a mask.  Performed a "no touch" examination due to Covid considerations.    Cardiac: Regular rate and rhythm Per PT Abdomen: Soft and nonacute Per Pt   Pulmonary: Clear bilaterally and no wheezing Per pt Neuro: Moves extremities X4  with no tremors   HEENT: No gross cranial nerve defects and anicteric sclera Skin: Skin warm and dry with no visible rashes   Vascular: Adequate perfusion of extremities with no cyanosis Psych: Alert and oriented x3 with appropriate affect   Lymphatics: No swelling of arms/hands with no pedal edema Neck: FROM with no JVD   Additional Notes:None    After review of the medical history and physical assessment, medications, allergies, patient's current medical condition, and labs, this patient is at acceptable risk for the planned procedure at this facility.      Electronically signed by: Nyra Jabs, MD, MD Date: 07/24/2020 at 7:12 AM

## 2020-08-12 LAB — SURGICAL PATHOLOGY

## 2020-08-13 NOTE — Other (Unsigned)
Patient Acct Nbr: [id number]   Primary AUTH/CERT:   Primary Insurance Company Name: Company secretary Plan name: [facility] Medicare  Primary Insurance Group Number: [id number]  Primary Insurance Plan Type: Health  Primary Insurance Policy Number: [id number]

## 2020-08-16 ENCOUNTER — Inpatient Hospital Stay: Admit: 2020-08-16 | Discharge: 2020-08-19 | Disposition: A | Discharge status: Home Health | Admitting: Family Medicine

## 2020-08-16 DIAGNOSIS — T148XXA Other injury of unspecified body region, initial encounter: Secondary | ICD-10-CM

## 2020-08-16 LAB — COMPREHENSIVE METABOLIC PANEL
ALT: 43 U/L — ABNORMAL HIGH (ref 0–34)
AST: 52 U/L — ABNORMAL HIGH (ref 15–46)
Albumin,Serum: 3.6 g/dL (ref 3.5–5.0)
Alkaline Phosphatase: 158 U/L — ABNORMAL HIGH (ref 38–126)
Anion Gap: 10 mmol/L (ref 3–13)
BUN: 22 mg/dL — ABNORMAL HIGH (ref 9–20)
CO2: 20 mmol/L — ABNORMAL LOW (ref 22–30)
Calcium: 8.7 mg/dL (ref 8.4–10.4)
Chloride: 102 mmol/L (ref 98–107)
Creatinine: 0.91 mg/dL (ref 0.52–1.25)
EGFR IF NonAfrican American: 62 mL/min (ref >60)
Glucose: 523 mg/dL (ref 70–100)
Potassium: 4.2 mmol/L (ref 3.5–5.1)
Sodium: 132 mmol/L — ABNORMAL LOW (ref 135–145)
Total Bilirubin: 1.1 mg/dL (ref 0.2–1.3)
Total Protein: 7.3 g/dL (ref 6.3–8.2)
eGFR African American: 71.9 mL/min (ref >60)

## 2020-08-16 LAB — URINALYSIS
Bilirubin Urine: NEGATIVE mg/dL
Glucose, Ur: >1000 mg/dL — AB (ref <70)
Ketones, Urine: NEGATIVE mg/dL
LEUKOCYTES, UA: NEGATIVE Leu/uL
Nitrite, Urine: NEGATIVE NA
Occult Blood,Urine: 0.2 mg/dL — AB
Specific Gravity, Urine: >1.03 NA — AB (ref 1.005–1.030)
Total Protein, Urine: 30 mg/dL — AB
Urobilinogen, Urine: NORMAL mg/dL (ref 0–1)
pH, Urine: 5 NA (ref 5.0–8.0)

## 2020-08-16 LAB — CBC WITH AUTO DIFFERENTIAL
Hematocrit: 34.7 % — ABNORMAL LOW (ref 35.0–47.0)
Hemoglobin: 10.8 g/dL — ABNORMAL LOW (ref 11.7–16.0)
MCH: 28.3 pg (ref 26.0–34.0)
MCHC: 31 % — ABNORMAL LOW (ref 32.0–36.0)
MCV: 91.1 fL (ref 79.0–98.0)
MPV: 9.9 fL (ref 7.4–10.4)
Platelets: 195 10*3/uL (ref 140–440)
RBC: 3.81 10*6/uL (ref 3.80–5.20)
RDW: 18.8 % — ABNORMAL HIGH (ref 11.5–14.5)
WBC: 126.3 10*3/uL (ref 3.6–10.7)

## 2020-08-16 LAB — MANUAL DIFFERENTIAL
Absolute Baso #: 3.8 10*3/uL — ABNORMAL HIGH (ref 0.0–0.2)
Absolute Eos #: 1.3 10*3/uL — ABNORMAL HIGH (ref 0.0–0.5)
Absolute Lymph #: 7.6 10*3/uL — ABNORMAL HIGH (ref 1.1–4.5)
Absolute Mono #: 3.8 10*3/uL — ABNORMAL HIGH (ref 0.2–1.1)
Absolute Neut #: 87.1 10*3/uL — ABNORMAL HIGH (ref 2.2–8.2)
Bands: 11 % — ABNORMAL HIGH (ref 0–3)
Basophils: 3 % — ABNORMAL HIGH (ref 0–2)
Blasts: 2 % — AB (ref <1)
Eosinophils: 1 % (ref 1–6)
Lymphocytes: 6 % — ABNORMAL LOW (ref 20–40)
Metamyelocytes: 7 % — AB (ref <1)
Monocytes: 3 % (ref 2–10)
Myelocytes: 6 % — AB (ref <1)
Promyelocytes: 3 % — AB (ref <1)
RBC Morphology: ABNORMAL NA
Seg Neutrophils: 58 % (ref 40–80)
TOTAL CELLS COUNTED: 100 NA

## 2020-08-16 LAB — TROPONIN: Troponin I: 0.019 ng/mL (ref 0.000–0.034)

## 2020-08-16 LAB — PROTIME/INR & PTT
INR: 1.2 NA — ABNORMAL HIGH (ref 0.9–1.1)
Protime: 13 s — ABNORMAL HIGH (ref 9.0–12.0)
aPTT: 37.3 s — ABNORMAL HIGH (ref 20.0–30.5)

## 2020-08-16 LAB — MAGNESIUM: Magnesium: 1.7 mg/dL (ref 1.6–2.3)

## 2020-08-16 LAB — CK: Total CK: 151 U/L (ref 30–170)

## 2020-08-16 LAB — POCT GLUCOSE: POC Glucose: 361 mg/dL — ABNORMAL HIGH (ref 70–100)

## 2020-08-16 MED ORDER — INSULIN REGULAR HUMAN 100 UNIT/ML IJ SOLN
100 UNIT/ML | Freq: Once | INTRAMUSCULAR | Status: AC
Start: 2020-08-16 — End: 2020-08-16
  Administered 2020-08-16: 19:00:00 via SUBCUTANEOUS

## 2020-08-16 MED ORDER — SODIUM CHLORIDE 0.9 % IV BOLUS
0.9 | Freq: Once | INTRAVENOUS | Status: AC
Start: 2020-08-16 — End: 2020-08-16
  Administered 2020-08-16: 18:00:00 via INTRAVENOUS

## 2020-08-16 MED ORDER — OXYCODONE HCL 5 MG PO TABS
5 MG | ORAL | Status: DC | PRN
Start: 2020-08-16 — End: 2020-08-19
  Administered 2020-08-16 – 2020-08-19 (×8): 5 mg via ORAL

## 2020-08-16 MED ORDER — NORMAL SALINE FLUSH 0.9 % IV SOLN
0.9 | Freq: Three times a day (TID) | INTRAVENOUS | Status: DC
Start: 2020-08-16 — End: 2020-08-16

## 2020-08-16 MED FILL — OXYCODONE HCL 5 MG PO TABS: 5 MG | ORAL | Qty: 1

## 2020-08-16 NOTE — Progress Notes (Signed)
Arrived from ER, via cart, to 150

## 2020-08-16 NOTE — ED Provider Notes (Signed)
[place] [facility]  eMERGENCY dEPARTMENT eNCOUnter      Pt Name: [name]  MRN: [id number]  [place] 03/16/1946  Date of evaluation: 08/16/2020  Provider: Barbette Reichmann, APRN - CNP   This patient was seen in conjunction with Dr. Rejeana Brock    CHIEF COMPLAINT       Chief Complaint   Patient presents with   ??? Fatigue     Pt sent over from [facility] by Dr. Awanda Mink. Pt with multiple bruises over body, arms, knee and chin. Pt states has been feeling weak and fell yesterday. Pt is on eloquis.         HISTORY OF PRESENT ILLNESS   (Location/Symptom, Timing/Onset,Context/Setting, Quality, Duration, Modifying Factors, Severity) Note limiting factors.   HPI    [name] is a 75 y.o. female who presents to the emergency department with bruises all over her body.  Patient was sent over by Dr. Bartholome Bill, she is on Eliquis because of a history of atrial fibrillation.  She recently was diagnosed with leukemia.  Patient states she fell in the yard 2 days ago, she states she is absolutely positive that she did not hit her head.  She has bruising around the left eye to the left chin.  She also has bruising to the right inner elbow.  She complains of pain in the right hip and thigh.  She states she has some bruising on her abdomen.  States she was recently started on a medicine for her leukemia.  She denies any other complaints.  Dr. Bartholome Bill was concerned because he felt the patient was off, for me she is alert and oriented x4 can answer questions appropriately, her affect was offered Dr. Bartholome Bill but I am unclear as to her normal affect this is the first time I evaluated the patient.  Nursing Notes were reviewed.    REVIEW OF SYSTEMS    (2+ for4; 10+ for level 5)     Review of Systems   Constitutional: Negative for activity change, appetite change, chills and fever.   HENT: Negative for congestion, ear discharge, ear pain, hearing loss, postnasal drip, rhinorrhea and sore throat.    Eyes: Negative for discharge and redness.   Respiratory:  Negative for cough, chest tightness, shortness of breath and wheezing.    Cardiovascular: Negative for chest pain and palpitations.   Gastrointestinal: Negative for abdominal pain, diarrhea, nausea and vomiting.   Genitourinary: Negative for dysuria.   Musculoskeletal: Negative for arthralgias and myalgias.   Skin: Negative for color change.   Neurological: Negative for dizziness, weakness, light-headedness and headaches.   Hematological: Negative for adenopathy. Bruises/bleeds easily.   Psychiatric/Behavioral: Positive for confusion. Negative for agitation and dysphoric mood.   All other systems reviewed and are negative.      PAST MEDICAL HISTORY     Past Medical History:   Diagnosis Date   ??? Anxiety    ??? Depression    ??? Diabetes mellitus ([place])    ??? Heart murmur    ??? Hyperlipidemia    ??? Hypertension    ??? Thyroid disease        SURGICALHISTORY       Past Surgical History:   Procedure Laterality Date   ??? BACK SURGERY     ??? CT BIOPSY PERCUTANEOUS DEEP BONE  07/24/2020    CT BIOPSY PERCUTANEOUS DEEP BONE   ??? HYSTERECTOMY         CURRENT MEDICATIONS       Previous Medications  AMLODIPINE (NORVASC) 5 MG TABLET    Take 5 mg by mouth daily    APIXABAN (ELIQUIS) 5 MG TABS TABLET    Take 5 mg by mouth 2 times daily    ATORVASTATIN (LIPITOR) 80 MG TABLET    take 1 tablet by mouth once daily    CYCLOBENZAPRINE (FLEXERIL) 10 MG TABLET    Take 10 mg by mouth 3 times daily as needed for Muscle spasms    FLUTICASONE (FLONASE) 50 MCG/ACT NASAL SPRAY    instill 1 spray into each nostril daily    GABAPENTIN (NEURONTIN) 100 MG CAPSULE    Take 100 mg by mouth 3 times daily.    GLIMEPIRIDE (AMARYL) 2 MG TABLET    Take 2 mg by mouth every morning (before breakfast)    INSULIN GLARGINE (LANTUS) 100 UNIT/ML INJECTION VIAL    Inject into the skin nightly    INSULIN LISPRO (HUMALOG) 100 UNIT/ML INJECTION VIAL    Inject into the skin 3 times daily (before meals)    LEVOTHYROXINE (SYNTHROID) 112 MCG TABLET    Take 88 mcg by mouth Daily      LISINOPRIL (PRINIVIL;ZESTRIL) 20 MG TABLET    Take 20 mg by mouth daily    METOPROLOL TARTRATE (LOPRESSOR) 25 MG TABLET    Take 25 mg by mouth 2 times daily    SEMAGLUTIDE,0.25 OR 0.5MG/DOS, (OZEMPIC, 0.25 OR 0.5 MG/DOSE,) 2 MG/1.5ML SOPN    Inject 0.25 mg into the skin once a week    SERTRALINE (ZOLOFT) 100 MG TABLET    Take 100 mg by mouth daily    SITAGLIPTIN (JANUVIA) 100 MG TABLET    Take 50 mg by mouth daily    TRESIBA FLEXTOUCH 100 UNIT/ML SOPN    inject 60 units subcutaneously at bedtime            Codeine    FAMILY HISTORY       Family History   Problem Relation Age of Onset   ??? Stroke Mother    ??? Diabetes Father    ??? Heart Disease Father           SOCIAL HISTORY       Social History     Socioeconomic History   ??? Marital status: Divorced     Spouse name: Not on file   ??? Number of children: Not on file   ??? Years of education: Not on file   ??? Highest education level: Not on file   Occupational History   ??? Not on file   Tobacco Use   ??? Smoking status: Never Smoker   ??? Smokeless tobacco: Never Used   Vaping Use   ??? Vaping Use: Never used   Substance and Sexual Activity   ??? Alcohol use: No   ??? Drug use: No   ??? Sexual activity: Not on file   Other Topics Concern   ??? Not on file   Social History Narrative   ??? Not on file     Social Determinants of Health     Financial Resource Strain:    ??? Difficulty of Paying Living Expenses: Not on file   Food Insecurity:    ??? Worried About Running Out of Food in the Last Year: Not on file   ??? Ran Out of Food in the Last Year: Not on file   Transportation Needs:    ??? Lack of Transportation (Medical): Not on file   ??? Lack of Transportation (Non-Medical): Not on file  Physical Activity:    ??? Days of Exercise per Week: Not on file   ??? Minutes of Exercise per Session: Not on file   Stress:    ??? Feeling of Stress : Not on file   Social Connections:    ??? Frequency of Communication with Friends and Family: Not on file   ??? Frequency of Social Gatherings with Friends and Family: Not on  file   ??? Attends Religious Services: Not on file   ??? Active Member of Clubs or Organizations: Not on file   ??? Attends Archivist Meetings: Not on file   ??? Marital Status: Not on file   Intimate Partner Violence:    ??? Fear of Current or Ex-Partner: Not on file   ??? Emotionally Abused: Not on file   ??? Physically Abused: Not on file   ??? Sexually Abused: Not on file   Housing Stability:    ??? Unable to Pay for Housing in the Last Year: Not on file   ??? Number of Places Lived in the Last Year: Not on file   ??? Unstable Housing in the Last Year: Not on file       SCREENINGS    Glasgow Coma Scale  Eye Opening: Spontaneous  Best Verbal Response: Oriented  Best Motor Response: Obeys commands  Glasgow Coma Scale Score: 15 @FLOW ([id number])@    PHYSICAL EXAM    (5+ for level 4, 8+ for level 5)     ED Triage Vitals [08/16/20 1214]   BP Temp Temp Source Pulse Resp SpO2 Height Weight   (!) 171/70 98.4 ??F (36.9 ??C) Temporal 77 18 97 % -- --       Physical Exam  Vitals and nursing note reviewed.   Constitutional:       General: She is not in acute distress.     Appearance: Normal appearance. She is normal weight. She is not ill-appearing or toxic-appearing.   HENT:      Head: Normocephalic.      Comments: TMs are clear bilaterally, there is no tenderness to the left periorbital region, there is periorbital ecchymosis noted around the left eye, there is no tenderness to the left side of the mandible, ecchymosis noted to the left lower mandible the teeth align normally the midface is stable.     Right Ear: Tympanic membrane normal.      Left Ear: Tympanic membrane normal.   Eyes:      Extraocular Movements: Extraocular movements intact.      Conjunctiva/sclera: Conjunctivae normal.      Pupils: Pupils are equal, round, and reactive to light.   Cardiovascular:      Rate and Rhythm: Normal rate and regular rhythm.      Pulses: Normal pulses.      Heart sounds: Normal heart sounds. No murmur heard.      Pulmonary:      Effort:  Pulmonary effort is normal. No respiratory distress.      Breath sounds: Normal breath sounds. No stridor. No wheezing or rhonchi.   Abdominal:      Comments: The abdomen is soft, nondistended and nontender.  There is no rebound tenderness or guarding.  Bowel sounds are normal.  Bruising noted to the abdominal wall.   Musculoskeletal:      Cervical back: Normal range of motion and neck supple. No rigidity or tenderness.      Comments: There is no pain on palpation to the spine.  Straight leg raise is  negative on the left and right.  There is no weakness noted with adduction of either thigh. There is no weakness noted hip flexion, knee extension, knee flexion.  There is no weakness noted to dorsiflexion of the foot, dorsiflexion of the great toe or plantarflexion of the foot.  Patellar reflexes are 2+ bilaterally.  DP pulses are 2+.  There are no sensory deficits to the lower extremities, no saddle anesthesia.  Sensation is intact to the groin, perianal sensation is intact.  There is mild pain with range of motion of the right hip.   Lymphadenopathy:      Cervical: No cervical adenopathy.   Skin:     General: Skin is warm and dry.      Capillary Refill: Capillary refill takes less than 2 seconds.      Coloration: Skin is not jaundiced or pale.      Findings: Bruising present. No erythema.   Neurological:      General: No focal deficit present.      Mental Status: She is alert and oriented to person, place, and time. Mental status is at baseline.      Cranial Nerves: No cranial nerve deficit.      Comments: NIH is 0.   Psychiatric:         Mood and Affect: Mood normal.         DIAGNOSTIC RESULTS     EKG (Per Emergency Physician):   All electrocardiograms are interpreted by the Emergency Department physician in the absence of a cardiologist.  Please see their accompanying note for interpretation.    RADIOLOGY (Per EmergencyPhysician):       Interpretation per the Radiologist below, if available at the time of this  note:  XR LUMBAR SPINE (2-3 VIEWS)    Result Date: 08/16/2020  Patient Name:                JENNIE, HANNAY MRN:                         [id number] Acct#:                       [id number] [place] [phone]             08/16/2020 13:05 EDT      CR Spine Lumbosacral 2    Mann Skaggs, CNP, Saabir Blyth M or 3 Views CPT code 72100 Reason For Exam (CR Spine Lumbosacral 2 or 3 Views) pain, fall Report LIMITED LUMBAR SPINE History:  Low back pain, fall injury Findings:  Frontal, lateral, and spot lateral views show degenerative changes of the lumbar and partially visualized lower dorsal spine with multilevel marginal vertebral spurs . There is fusion of L1 and L2 and also L4 and L5 vertebrae with obliterated disc spaces. There is narrowing of all lumbar disc spaces with multilevel partial laminectomy with hypertrophic/lytic facet joint arthropathy. There is minimal retrolisthesis of L3 on L4. There is mild right convex lumbar spine curvature. There are aortoiliac and other atherosclerotic calcifications. There are multiple right upper quadrant calcifications suggesting gallstones. IMPRESSION: Extensive spondylosis with post surgical spine changes as described. RIGHT HIP JOINT AND RIGHT FEMUR  History: Right hip and femur pain Findings: Two views of the right hip joint, frontal view of the pelvis were obtained and additional frontal lateral views of the entire right femur were obtained the right hip joint shows no acute fracture, dislocation, periosteal reaction. The bilateral hip joints spaces are maintained. There are marginal spurs at the right and left femoral trochanters. There is limited visualization of the sacrum due to overlying bowel gas. The bilateral ischiopubic bones are unremarkable. Additional frontal lateral views of the right femur show no fracture, dislocation, periosteal reaction. 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 IMPRESSION: No acute right hip joint or femur process. Osteoarthritis changes.                                          Diagnostic Radiology Report Report Dictated on Workstation: [id number] ---** Final ---** Dictating Physician: Dionisio David, MD, AHMAD Signed Date and Time: 08/16/2020 1:32 pm Signed by: Dionisio David, MD, AHMAD Transcribed Date and Time: 08/16/2020 1:33    XR HIP RIGHT (2-3 VIEWS)    Result Date: 08/16/2020  Patient Name:                MALISHA, MABEY MRN:                         [id number] Acct#:                       [id number] Diagnostic Radiology ACCESSION                 EXAM [place] [phone]             08/16/2020 13:05 EDT      CR Hip w/ Pelvis 2 or 3   Bayan Kushnir, CNP, Pepper Kerrick M Views Right n CPT code [phone] Reason For Exam (CR Hip w/ Pelvis 2 or 3 Views Right n) pain, fall Report LIMITED LUMBAR SPINE History:  Low back pain, fall injury Findings:  Frontal, lateral, and spot lateral views show degenerative changes of the lumbar and partially visualized lower dorsal spine with multilevel marginal vertebral spurs . There is fusion of L1 and L2 and also L4 and L5 vertebrae with obliterated disc spaces. There is narrowing of all lumbar disc spaces with multilevel partial laminectomy with hypertrophic/lytic facet joint arthropathy. There is minimal retrolisthesis of L3 on L4. There is mild right convex lumbar spine curvature. There are aortoiliac and other atherosclerotic calcifications. There are multiple right upper quadrant calcifications suggesting gallstones. IMPRESSION: Extensive spondylosis with post surgical spine changes as described. RIGHT HIP JOINT AND RIGHT FEMUR History: Right hip and femur pain Findings: Two views of the right hip joint, frontal view of the pelvis were obtained and additional frontal lateral views of the entire right femur  were obtained the right hip joint shows no acute fracture, dislocation, periosteal reaction. The bilateral hip joints spaces are maintained. There are marginal spurs at the right and left femoral trochanters. There is limited visualization of the sacrum due to overlying bowel gas. The bilateral ischiopubic bones are unremarkable. Additional frontal lateral views of the right femur show no fracture, dislocation, periosteal  reaction. There is right knee joint osteoarthritis with marginal spurs and narrowing of the tibiofemoral joint space. IMPRESSION: No acute right hip joint or femur process. Osteoarthritis changes.                                          Diagnostic Radiology Report Report Dictated on Workstation: [id number] ---** Final ---** Dictating Physician: Dionisio David, MD, AHMAD Signed Date and Time: 08/16/2020 1:32 pm Signed by: Dionisio David, MD, AHMAD Transcribed Date and Time: 08/16/2020 1:33    XR FEMUR RIGHT (MIN 2 VIEWS)    Result Date: 08/16/2020  Patient Name:                DASHAWN, GOLDA MRN:                         [id number] Acct#:                       [id number] [place] [phone]             08/16/2020 13:05 EDT      CR Femur 2+ Views Right   Ennifer Harston, CNP, Ardis Fullwood M n CPT code [phone] Reason For Exam (CR Femur 2+ Views Right n) pain Report LIMITED LUMBAR SPINE History:  Low back pain, fall injury Findings:  Frontal, lateral, and spot lateral views show degenerative changes of the lumbar and partially visualized lower dorsal spine with multilevel marginal vertebral spurs . There is fusion of L1 and L2 and also L4 and L5 vertebrae with obliterated disc spaces. There is narrowing of all lumbar disc spaces with multilevel partial laminectomy with hypertrophic/lytic facet joint arthropathy. There is minimal retrolisthesis of L3 on L4. There is mild right convex lumbar spine curvature. There are  aortoiliac and other atherosclerotic calcifications. There are multiple right upper quadrant calcifications suggesting gallstones. IMPRESSION: Extensive spondylosis with post surgical spine changes as described. RIGHT HIP JOINT AND RIGHT FEMUR History: Right hip and femur pain Findings: Two views of the right hip joint, frontal view of the pelvis were obtained and additional frontal lateral views of the entire right femur were obtained the right hip joint shows no acute fracture, dislocation, periosteal reaction. The bilateral hip joints spaces are maintained. There are marginal spurs at the right and left femoral trochanters. There is limited visualization of the sacrum due to overlying bowel gas. The bilateral ischiopubic bones are unremarkable. Additional frontal lateral views of the right femur show no fracture, dislocation, periosteal reaction. There is right knee joint osteoarthritis with marginal spurs and narrowing of the tibiofemoral joint space. IMPRESSION: No acute right hip joint or femur process. Osteoarthritis changes.                                          Diagnostic Radiology Report Report Dictated on Workstation: [id number] ---** Final ---** Dictating Physician: Dionisio David, MD, AHMAD Signed Date and Time: 08/16/2020 1:32 pm Signed by: Dionisio David, MD, AHMAD Transcribed Date and Time: 08/16/2020  1:33    CT Head WO Contrast    Result Date: 08/16/2020  Patient Name:                EMMALI, KAROW MRN:                         [id number] Acct#:                       [id number] Computed Tomography ACCESSION                 EXAM DATE/TIME           PROCEDURE                 ORDERING PROVIDER [phone]             08/16/2020 12:43 EDT      CT Head or Brain w/o      Favor Kreh, CNP, Davida Falconi M Contrast CPT code [phone] Reason For Exam (CT Head or Brain w/o Contrast) head injury, fall, altered mental status Report CLINICAL INFORMATION:  Headache following head trauma. CT HEAD WITHOUT INTRAVENOUS CONTRAST:    Volume acquisition CT images are obtained from foramen magnum to vertex without intravenous contrast axial, coronal and sagittal 2-D reconstructions.. Comparison is made to the examination of 318. The ventricles and sulci are unremarkable in size and configuration for age. No intra-axial mass lesion or mass-effect is seen. There is no evidence of intracranial hemorrhage or other focal abnormal intra-axial densities. There is atherosclerotic calcification of the intracavernous portions of both internal carotid arteries. The bony calvarium is intact. The root of a right-sided maxillary tooth, most likely a molar, extends into the posterior aspect of the right maxillary sinus with adjacent mucosal thickening. The mastoid air cells and included paranasal sinuses are otherwise clear. IMPRESSION: No evidence of acute intracranial abnormality. Report Dictated on Workstation: [id number] ---** Final ---** Dictating Physician: Ronald Lobo, MD, Theadora Rama Signed Date and Time: 08/16/2020 12:56 pm Signed by: Ronald Lobo, MD, Theadora Rama Transcribed Date and Time: 08/16/2020 12:57      :  Labs Reviewed   COMPREHENSIVE METABOLIC PANEL - Abnormal; Notable for the following components:       Result Value    Sodium 132 (*)     CO2 20 (*)     Glucose 523 (*)     BUN 22 (*)     Alkaline Phosphatase 158 (*)     ALT 43 (*)     AST 52 (*)     All other components within normal limits    Narrative:     Test Performed by [facility], [address]  [place], [place] [address]   PROTIME/INR & PTT - Abnormal; Notable for the following components:    Protime 13.0 (*)     INR 1.2 (*)     aPTT 37.3 (*)     All other components within normal limits    Narrative:     Test Performed by [facility], [place],  [place], [place] [address]   CBC WITH AUTO DIFFERENTIAL - Abnormal; Notable for the following components:    WBC 126.3 (*)     Hemoglobin 10.8 (*)     Hematocrit 34.7 (*)     MCHC 31.0 (*)     RDW 18.8 (*)     All other components within  normal limits    Narrative:     Test Performed  by [facility], [place],  [place], [place] [address]   MANUAL DIFFERENTIAL - Abnormal; Notable for the following components:    Bands 11 (*)     Lymphocytes 6 (*)     Basophils 3 (*)     Metamyelocytes 7 (*)     Myelocytes 6 (*)     Promyelocytes 3 (*)     Blasts 2 (*)     Absolute Neut # 87.1 (*)     Absolute Lymph # 7.6 (*)     Absolute Mono # 3.8 (*)     Absolute Eos # 1.3 (*)     Absolute Baso # 3.8 (*)     All other components within normal limits    Narrative:     Test Performed by [facility], [address]  [place], [place] [address]   MAGNESIUM    Narrative:     Test Performed by [facility], [address],  [place], [place] [address]   TROPONIN    Narrative:     Test Performed by [facility], [address]  [place], [place] [address]   CK    Narrative:     Test Performed by [facility], [place],  [place],  [address]   URINALYSIS       All other labs were within normal range or not returned as of this dictation.    EMERGENCY DEPARTMENT COURSE and DIFFERENTIALDIAGNOSIS/MDM:   Vitals:    Vitals:    08/16/20 1214 08/16/20 1323   BP: (!) 171/70 (!) 188/66   Pulse: 77 77   Resp: 18    Temp: 98.4 ??F (36.9 ??C)    TempSrc: Temporal    SpO2: 97% 96%       Medications   sodium chloride flush 0.9 % injection 3 mL (3 mLs IntraVENous Not Given 08/16/20 1250)   0.9 % sodium chloride bolus (1,000 mLs IntraVENous New Bag 08/16/20 1420)   insulin regular (HUMULIN R;NOVOLIN R) injection 10 Units (has no administration in time range)       MDM.  Patient presents with easy bruising, first time she had been evaluated by oncology today, they were concerned about her mental status, here she is alert and oriented x4, I personally never met the patient but she seems to be a factual informant.  Recent diagnosis of leukemia still in the process of treatment options and discussion with oncology.  Normal neurologic exam CT had  unremarkable, x-rays of the lumbar spine hip and leg also show osteoarthritis but no fracture.  CBC shows a white count of 126 however recent diagnosis of leukemia has not started treatment she also has a blood sugar of 523, she is on blood thinners because of atrial fibrillation.  CT head was also performed because she fell a few days ago and hit her head.  INR is 1.2, sodium is 132, ALT AST elevated at 43 and 52 respectively.  We will admit the patient for treatment of her high blood sugar, further work-up, we discussed with PCP.  CONSULTS:  None    PROCEDURES:  Unless otherwise noted below, none     Procedures    FINAL IMPRESSION      1. Bruising    2. Generalized weakness    3. Leukemia not having achieved remission, unspecified leukemia type ([place])    4. Type 2 diabetes mellitus with hyperglycemia, with long-term current use of insulin ([facility])  DISPOSITION/PLAN   DISPOSITION Admitted 08/16/2020 02:48:15 PM      PATIENT REFERRED TO:  No follow-up provider specified.    DISCHARGE MEDICATIONS:  New Prescriptions    No medications on file          (Please note:  Portions of this note were completed with a voice recognition program.  Efforts were made to edit thedictations but occasionally words and phrases are mis-transcribed.)    Form [id number]    Barbette Reichmann, APRN - CNP (electronically signed)  Emergency Medicine Provider           Barbette Reichmann, APRN - CNP  08/16/20 1456

## 2020-08-16 NOTE — ED Notes (Signed)
Dr. Gwenith Daily office contacted by supervisor for report. He states that this is his first time seeing this patient and does not really know her normally mentation but states she is a little "different". He believes that she is possibly more confused right now. He states that patient has recently fallen and is covered in bruises and hematomas.      Jearld Pies, RN  08/16/20 1225

## 2020-08-16 NOTE — ED Provider Notes (Signed)
Patient, [name], is seen and examined in conjunction with advanced practice practitioner.  I independently performed history, physical exam, admitted all diagnostic treatment and disposition decisions.    I wore a surgical mask for the entirety of this encounter.     In brief their history revealed scattered bruising, some falls, recent leukemia diagnosis, diabetes.    Patient was referred from hematology oncology office.  The provider there noted bruises on the arms and face with some history of falls.  Patient was felt to have a somewhat altered mental status as well.    Patient tells me she was somewhat recently diagnosed with leukemia and started an oral chemotherapy agent a week ago.  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    Their focused exam revealed awake, alert, sitting in a chair, appears somewhat anxious.  Mucous membranes are moderately dry.  There are scattered bruises including under the left side of the chin and the bilateral arms.  Elevated blood pressures noted, rest of vital signs are benign..    ED course: On work-up, glucose is 523 but anion gap is normal.  Bicarb is 20.  Sodium 132.    Serum white blood cell count 126,000, up from 120,000 earlier this month.  2 blasts today, 1 blast on prior smear.  Troponin normal.  Mildly elevated liver enzymes.    Given the significant hyperglycemia, frequent falls, and elevated white blood cell count I discussed the case with on-call for patient's PCP patient will be admitted.  Patient will receive 10 units of insulin and IV fluids as initial treatment for her hyperglycemia.  She tells me she is on Eliquis for atrial fibrillation.  She is in normal sinus rhythm on EKG here and will need to have a discussion with her long-term providers regarding risks and benefits of Eliquis.    All diagnostic, treatment, and disposition decisions were made by myself  in conjunction with the mid-level provider.    This will serve as my Supervisory note and shared attestation. I did perform a substantive portion of the visit including all aspects of the Medical Decision Making.    For all further details of the patient's emergency department visit, please see the advance practice provider's documentation.         Madilyn Fireman, MD  08/16/20 [phone]

## 2020-08-16 NOTE — Progress Notes (Signed)
Patient says she was prescribed two medications from dr Gwenith Daily 7 days ago.  Patient says she will have her sister bring in the medication botles

## 2020-08-17 LAB — CBC WITH AUTO DIFFERENTIAL
Hematocrit: 32.9 % — ABNORMAL LOW (ref 35.0–47.0)
Hemoglobin: 10.1 g/dL — ABNORMAL LOW (ref 11.7–16.0)
MCH: 27.7 pg (ref 26.0–34.0)
MCHC: 30.6 % — ABNORMAL LOW (ref 32.0–36.0)
MCV: 90.5 fL (ref 79.0–98.0)
MPV: 9.9 fL (ref 7.4–10.4)
Platelets: 200 10*3/uL (ref 140–440)
RBC: 3.64 10*6/uL — ABNORMAL LOW (ref 3.80–5.20)
RDW: 18.5 % — ABNORMAL HIGH (ref 11.5–14.5)
WBC: 130 10*3/uL (ref 3.6–10.7)

## 2020-08-17 LAB — POCT GLUCOSE
POC Glucose: 343 mg/dL — ABNORMAL HIGH (ref 70–100)
POC Glucose: 209 mg/dL — ABNORMAL HIGH (ref 70–100)
POC Glucose: 221 mg/dL — ABNORMAL HIGH (ref 70–100)
POC Glucose: 238 mg/dL — ABNORMAL HIGH (ref 70–100)

## 2020-08-17 LAB — MANUAL DIFFERENTIAL
Absolute Baso #: 3.9 10*3/uL — ABNORMAL HIGH (ref 0.0–0.2)
Absolute Eos #: 0 10*3/uL (ref 0.0–0.5)
Absolute Lymph #: 3.9 10*3/uL (ref 1.1–4.5)
Absolute Mono #: 3.9 10*3/uL — ABNORMAL HIGH (ref 0.2–1.1)
Absolute Neut #: 102.7 10*3/uL — ABNORMAL HIGH (ref 2.2–8.2)
Bands: 13 % — ABNORMAL HIGH (ref 0–3)
Basophils: 3 % — ABNORMAL HIGH (ref 0–2)
Eosinophils: 0 % — ABNORMAL LOW (ref 1–6)
Lymphocytes: 3 % — ABNORMAL LOW (ref 20–40)
Metamyelocytes: 6 % — AB (ref <1)
Monocytes: 3 % (ref 2–10)
Myelocytes: 3 % — AB (ref <1)
Promyelocytes: 3 % — AB (ref <1)
RBC Morphology: ABNORMAL NA
Seg Neutrophils: 66 % (ref 40–80)
TOTAL CELLS COUNTED: 100 NA
nRBC: 1 /100{WBCs} — ABNORMAL HIGH (ref <=0)

## 2020-08-17 LAB — COMPREHENSIVE METABOLIC PANEL W/ REFLEX TO MG FOR LOW K
ALT: 42 U/L — ABNORMAL HIGH (ref 0–34)
AST: 64 U/L — ABNORMAL HIGH (ref 15–46)
Albumin,Serum: 3.5 g/dL (ref 3.5–5.0)
Alkaline Phosphatase: 117 U/L (ref 38–126)
Anion Gap: 8 mmol/L (ref 3–13)
BUN: 25 mg/dL — ABNORMAL HIGH (ref 9–20)
CO2: 23 mmol/L (ref 22–30)
Calcium: 8.4 mg/dL (ref 8.4–10.4)
Chloride: 101 mmol/L (ref 98–107)
Creatinine: 0.89 mg/dL (ref 0.52–1.25)
EGFR IF NonAfrican American: 63.7 mL/min (ref >60)
Glucose: 245 mg/dL — ABNORMAL HIGH (ref 70–100)
Potassium: 4 mmol/L (ref 3.5–5.1)
Sodium: 133 mmol/L — ABNORMAL LOW (ref 135–145)
Total Bilirubin: 1.1 mg/dL (ref 0.2–1.3)
Total Protein: 7.2 g/dL (ref 6.3–8.2)
eGFR African American: 73.8 mL/min (ref >60)

## 2020-08-17 LAB — HEMOGLOBIN A1C
Estimated Avg Glucose: 249 mg/dL
Hemoglobin A1C: 10.3 % — AB

## 2020-08-17 MED ORDER — SITAGLIPTIN PHOSPHATE 100 MG PO TABS
100 MG | Freq: Every day | ORAL | Status: DC
Start: 2020-08-17 — End: 2020-08-16

## 2020-08-17 MED ORDER — DEXTROSE 5 % IV SOLN
5 | INTRAVENOUS | Status: DC | PRN
Start: 2020-08-17 — End: 2020-08-19

## 2020-08-17 MED ORDER — INSULIN LISPRO 100 UNIT/ML SC SOLN
100 UNIT/ML | Freq: Every evening | SUBCUTANEOUS | Status: DC
Start: 2020-08-17 — End: 2020-08-18
  Administered 2020-08-18: 01:00:00 via SUBCUTANEOUS

## 2020-08-17 MED ORDER — AMLODIPINE BESYLATE 5 MG PO TABS
5 MG | Freq: Every day | ORAL | Status: DC
Start: 2020-08-17 — End: 2020-08-19
  Administered 2020-08-17 – 2020-08-19 (×3): via ORAL

## 2020-08-17 MED ORDER — ACETAMINOPHEN 650 MG RE SUPP
650 MG | Freq: Four times a day (QID) | RECTAL | Status: DC | PRN
Start: 2020-08-17 — End: 2020-08-19

## 2020-08-17 MED ORDER — GLUCAGON HCL RDNA (DIAGNOSTIC) 1 MG IJ SOLR
1 MG | INTRAMUSCULAR | Status: DC | PRN
Start: 2020-08-17 — End: 2020-08-19

## 2020-08-17 MED ORDER — METOPROLOL TARTRATE 25 MG PO TABS
25 MG | Freq: Two times a day (BID) | ORAL | Status: DC
Start: 2020-08-17 — End: 2020-08-19
  Administered 2020-08-17 – 2020-08-19 (×5): via ORAL

## 2020-08-17 MED ORDER — FLUTICASONE PROPIONATE 50 MCG/ACT NA SUSP
50 MCG/ACT | Freq: Every day | NASAL | Status: DC
Start: 2020-08-17 — End: 2020-08-19
  Administered 2020-08-17 – 2020-08-19 (×3): via NASAL

## 2020-08-17 MED ORDER — NORMAL SALINE FLUSH 0.9 % IV SOLN
0.9 % | INTRAVENOUS | Status: DC | PRN
Start: 2020-08-17 — End: 2020-08-19

## 2020-08-17 MED ORDER — IMATINIB MESYLATE 400 MG PO TABS
400 MG | Freq: Every evening | ORAL | Status: DC
Start: 2020-08-17 — End: 2020-08-19

## 2020-08-17 MED ORDER — ATORVASTATIN CALCIUM 80 MG PO TABS
80 MG | Freq: Every evening | ORAL | Status: DC
Start: 2020-08-17 — End: 2020-08-19
  Administered 2020-08-18 – 2020-08-19 (×2): via ORAL

## 2020-08-17 MED ORDER — INSULIN DEGLUDEC 100 UNIT/ML SC SOPN
100 UNIT/ML | Freq: Every day | SUBCUTANEOUS | Status: DC
Start: 2020-08-17 — End: 2020-08-16

## 2020-08-17 MED ORDER — GLUCOSE 40 % PO GEL
40 % | ORAL | Status: DC | PRN
Start: 2020-08-17 — End: 2020-08-19

## 2020-08-17 MED ORDER — GABAPENTIN 100 MG PO CAPS
100 MG | Freq: Every day | ORAL | Status: DC
Start: 2020-08-17 — End: 2020-08-19
  Administered 2020-08-17 – 2020-08-19 (×3): via ORAL

## 2020-08-17 MED ORDER — SERTRALINE HCL 100 MG PO TABS
100 MG | Freq: Every day | ORAL | Status: DC
Start: 2020-08-17 — End: 2020-08-19
  Administered 2020-08-17 – 2020-08-19 (×3): via ORAL

## 2020-08-17 MED ORDER — GLIPIZIDE 5 MG PO TABS
5 MG | Freq: Every day | ORAL | Status: DC
Start: 2020-08-17 — End: 2020-08-19
  Administered 2020-08-17 – 2020-08-19 (×3): via ORAL

## 2020-08-17 MED ORDER — OXYCODONE HCL 5 MG PO TABS
5 MG | ORAL | Status: DC | PRN
Start: 2020-08-17 — End: 2020-08-16

## 2020-08-17 MED ORDER — APIXABAN 5 MG PO TABS
5 MG | Freq: Two times a day (BID) | ORAL | Status: DC
Start: 2020-08-17 — End: 2020-08-17
  Administered 2020-08-17 (×2): via ORAL

## 2020-08-17 MED ORDER — ACETAMINOPHEN 325 MG PO TABS
325 MG | Freq: Four times a day (QID) | ORAL | Status: DC | PRN
Start: 2020-08-17 — End: 2020-08-19
  Administered 2020-08-18: 01:00:00 650 mg via ORAL

## 2020-08-17 MED ORDER — SEMAGLUTIDE(0.25 OR 0.5MG/DOS) 2 MG/1.5ML SC SOPN
21.5 MG/1.5ML | SUBCUTANEOUS | Status: DC
Start: 2020-08-17 — End: 2020-08-16

## 2020-08-17 MED ORDER — LISINOPRIL 20 MG PO TABS
20 MG | Freq: Every day | ORAL | Status: DC
Start: 2020-08-17 — End: 2020-08-19
  Administered 2020-08-17 – 2020-08-19 (×3): via ORAL

## 2020-08-17 MED ORDER — SEMAGLUTIDE(0.25 OR 0.5MG/DOS) 2 MG/1.5ML SC SOPN
21.5 MG/1.5ML | SUBCUTANEOUS | Status: DC
Start: 2020-08-17 — End: 2020-08-19

## 2020-08-17 MED ORDER — INSULIN GLARGINE 100 UNIT/ML SC SOLN
100 UNIT/ML | Freq: Every day | SUBCUTANEOUS | Status: DC
Start: 2020-08-17 — End: 2020-08-19
  Administered 2020-08-17 – 2020-08-19 (×3): via SUBCUTANEOUS

## 2020-08-17 MED ORDER — LINAGLIPTIN 5 MG PO TABS
5 MG | Freq: Every day | ORAL | Status: DC
Start: 2020-08-17 — End: 2020-08-19
  Administered 2020-08-17 – 2020-08-19 (×3): via ORAL

## 2020-08-17 MED ORDER — POLYETHYLENE GLYCOL 3350 17 G PO PACK
17 g | Freq: Every day | ORAL | Status: DC | PRN
Start: 2020-08-17 — End: 2020-08-19

## 2020-08-17 MED ORDER — INSULIN LISPRO 100 UNIT/ML SC SOLN
100 UNIT/ML | Freq: Three times a day (TID) | SUBCUTANEOUS | Status: DC
Start: 2020-08-17 — End: 2020-08-18
  Administered 2020-08-17 – 2020-08-18 (×3): via SUBCUTANEOUS

## 2020-08-17 MED ORDER — ONDANSETRON 4 MG PO TBDP
4 MG | Freq: Three times a day (TID) | ORAL | Status: DC | PRN
Start: 2020-08-17 — End: 2020-08-19

## 2020-08-17 MED ORDER — CYCLOBENZAPRINE HCL 10 MG PO TABS
10 MG | Freq: Three times a day (TID) | ORAL | Status: DC | PRN
Start: 2020-08-17 — End: 2020-08-19
  Administered 2020-08-18 (×2): 10 mg via ORAL

## 2020-08-17 MED ORDER — NORMAL SALINE FLUSH 0.9 % IV SOLN
0.9 | Freq: Two times a day (BID) | INTRAVENOUS | Status: DC
Start: 2020-08-17 — End: 2020-08-19
  Administered 2020-08-17 – 2020-08-19 (×5): via INTRAVENOUS

## 2020-08-17 MED ORDER — LEVOTHYROXINE SODIUM 88 MCG PO TABS
88 MCG | Freq: Every day | ORAL | Status: DC
Start: 2020-08-17 — End: 2020-08-19
  Administered 2020-08-17 – 2020-08-19 (×3): via ORAL

## 2020-08-17 MED ORDER — ONDANSETRON HCL 4 MG/2ML IJ SOLN
4 MG/2ML | Freq: Four times a day (QID) | INTRAMUSCULAR | Status: DC | PRN
Start: 2020-08-17 — End: 2020-08-19

## 2020-08-17 MED ORDER — SODIUM CHLORIDE 0.9 % IV SOLN
0.9 % | INTRAVENOUS | Status: DC | PRN
Start: 2020-08-17 — End: 2020-08-19

## 2020-08-17 MED ORDER — DEXTROSE 50 % IV SOLN
50 | INTRAVENOUS | Status: DC | PRN
Start: 2020-08-17 — End: 2020-08-19

## 2020-08-17 MED ORDER — ALLOPURINOL 300 MG PO TABS
300 MG | Freq: Every evening | ORAL | Status: DC
Start: 2020-08-17 — End: 2020-08-19
  Administered 2020-08-17 – 2020-08-19 (×3): via ORAL

## 2020-08-17 MED FILL — FLUTICASONE PROPIONATE 50 MCG/ACT NA SUSP: 50 MCG/ACT | NASAL | Qty: 16

## 2020-08-17 MED FILL — LISINOPRIL 20 MG PO TABS: 20 MG | ORAL | Qty: 1

## 2020-08-17 MED FILL — LEVOTHYROXINE SODIUM 88 MCG PO TABS: 88 MCG | ORAL | Qty: 1

## 2020-08-17 MED FILL — LANTUS 100 UNIT/ML SC SOLN: 100 UNIT/ML | SUBCUTANEOUS | Qty: 1

## 2020-08-17 MED FILL — AMLODIPINE BESYLATE 5 MG PO TABS: 5 MG | ORAL | Qty: 1

## 2020-08-17 MED FILL — CYCLOBENZAPRINE HCL 10 MG PO TABS: 10 MG | ORAL | Qty: 1

## 2020-08-17 MED FILL — TRADJENTA 5 MG PO TABS: 5 MG | ORAL | Qty: 1

## 2020-08-17 MED FILL — SERTRALINE HCL 100 MG PO TABS: 100 MG | ORAL | Qty: 1

## 2020-08-17 MED FILL — OXYCODONE HCL 5 MG PO TABS: 5 MG | ORAL | Qty: 1

## 2020-08-17 MED FILL — GABAPENTIN 100 MG PO CAPS: 100 MG | ORAL | Qty: 1

## 2020-08-17 MED FILL — METOPROLOL TARTRATE 25 MG PO TABS: 25 MG | ORAL | Qty: 1

## 2020-08-17 MED FILL — ALLOPURINOL 300 MG PO TABS: 300 MG | ORAL | Qty: 1

## 2020-08-17 MED FILL — IMATINIB MESYLATE 400 MG PO TABS: 400 MG | ORAL | Qty: 1

## 2020-08-17 MED FILL — GLIPIZIDE 5 MG PO TABS: 5 MG | ORAL | Qty: 1

## 2020-08-17 MED FILL — HUMALOG 100 UNIT/ML SC SOLN: 100 UNIT/ML | SUBCUTANEOUS | Qty: 300

## 2020-08-17 MED FILL — ELIQUIS 5 MG PO TABS: 5 MG | ORAL | Qty: 1

## 2020-08-17 MED FILL — ATORVASTATIN CALCIUM 80 MG PO TABS: 80 MG | ORAL | Qty: 1

## 2020-08-17 NOTE — Plan of Care (Signed)
Problem: Falls - Risk of:  Goal: Will remain free from falls  Description: Will remain free from falls  08/17/2020 0500 by Hal Hope, RN  Outcome: Met This Shift  08/16/2020 1748 by Ria Comment, RN  Outcome: Met This Shift  08/16/2020 1732 by Ria Comment, RN  Outcome: Met This Shift  Goal: Absence of physical injury  Description: Absence of physical injury  08/17/2020 0500 by Hal Hope, RN  Outcome: Met This Shift  08/16/2020 1748 by Ria Comment, RN  Outcome: Met This Shift  08/16/2020 1732 by Ria Comment, RN  Outcome: Met This Shift

## 2020-08-17 NOTE — Plan of Care (Signed)
Problem: Falls - Risk of:  Goal: Will remain free from falls  Description: Will remain free from falls  08/17/2020 1807 by Mellody Memos, RN  Outcome: Met This Shift  08/17/2020 0500 by Hal Hope, RN  Outcome: Met This Shift     Problem: Pain:  Goal: Pain level will decrease  Description: Pain level will decrease  Outcome: Met This Shift

## 2020-08-17 NOTE — IP Home Care (Signed)
Home care liaison following case peripherally for discharge needs.  Electronically signed by Rubbie Battiest, LPN on 5/36/1443 at 2:04 PM

## 2020-08-17 NOTE — Care Coordination-Inpatient (Signed)
Discharge Planning  Current Residence: Private Residence  Living Arrangements: Family Members  Support Systems: Family Members  Current Services Prior To Admission: Durable Medical Equipment  DME: Cane,Walker,Glucometer,Shower Chair  Potential Assistance Needed: Home Care  Potential Assistance Purchasing Medications: No  Type of Home Care Services: None  Patient expects to be discharged to:: Apartment  Expected Discharge Date: 08/18/20  Follow Up Appointment: Best Day/Time :  (prefers afternoon appts)    Emergency Contacts  Contact 1: Name: [name] DULL  Contact 1: Number: [phone]  Contact 1: Relationship: SISTER     Social/Functional History  Lives With: Alone  Type of Home: Apartment  Home Layout: Multi-level  Home Access: Level entry  Bathroom Shower/Tub: Paediatric nurse without back  Armed forces technical officer: Toilet raiser,Grab bars in Location manager: Environmental consultant accessible  Home Equipment: Grab bars  Receives Help From: [employer]  ADL Assistance: Independent  Homemaking Assistance: Independent  Homemaking Responsibilities: Yes  Ambulation Assistance: Independent  Transfer Assistance: Independent  Active Driver: Yes  Mode of Transportation: SUV  Education: NA  Occupation: Retired  Type of occupation: NA  Leisure & Hobbies: NA  IADL Comments: NA  Additional Comments: [facility] [place]     Observation status from home with hyperglycemia. Patient sent to ER from [facility]. Has recent diagnosis of leukemia. Pass multiple bruises on her face and arms. States fell in yard a few days ago. Is alert and oriented, but slightly forgetful and delayed in responding to questions. Oncology consult, ss insulin coverage, pt/ot. Discharge preparation checklist reviewed with patient. Has prescription coverage and is able to afford her medications.  Is IDDM and states does not really check blood sugar. Is agreeable to home care services. Home care liaison notified via careport  to follow. Plans to drive self home after discharge from hospital, as car is parked in Er parking lot. Tentative discharge plan is home with home care when medically stable. Will request lsw be added to home care services to assist with additional community resources. Observation status reviewed and copy of [facility] letter given. Electronically signed by Nyra Capes, RN on 08/17/2020 at 1:57 PM

## 2020-08-17 NOTE — Progress Notes (Signed)
Occupational Therapy   Occupational Therapy Initial Assessment  Date: 08/17/2020   Patient Name: [name]  MRN: [id number]     DOB: 1946-02-28    Date of Service: 08/17/2020     Having reviewed the treatment plan and goals for this patient, I certify that the plan of care below is medically necessary and appropriate.    Discharge Recommendations:  Home with Home health OT  OT Equipment Recommendations  Equipment Needed: No    Assessment   Performance deficits / Impairments: Decreased functional mobility ;Decreased balance;Decreased ADL status;Decreased high-level IADLs;Decreased endurance  Assessment: Pt admitted to ED on 3/25 with fatigue and multiple bruises from a recent fall. Pts CTs and XRAYs negative for acute injuries. Pt recently dx with leukemia. Prior to admission, pt lived alone and performed ADls and mobility independently. Upon eval, she required SBA for bed mobility, SBA for transfers and mobility with FWW, and SBA for ADLs. Pt is just slightly below baseline d/t deficits of balance and endurance. She is expected to benefit from skilled OT services to address these deficits. Recommend HHC upon DC.  Prognosis: Good  Decision Making: Medium Complexity  History: leukemia  Exam: AM-PAC  Assistance / Modification: SBA  OT Education: OT Role;Plan of Care;Equipment;Transfer Training  Barriers to Learning: none  REQUIRES OT FOLLOW UP: Yes  Activity Tolerance  Activity Tolerance: Patient limited by fatigue  Safety Devices  Safety Devices in place: Yes  Type of devices: All fall risk precautions in place;Patient at risk for falls;Gait belt;Nurse notified;Call light within reach;Left in chair (chair alarm intact)           Patient Diagnosis(es): The primary encounter diagnosis was Bruising. Diagnoses of Generalized weakness, Leukemia not having achieved remission, unspecified leukemia type (HCC), and Type 2 diabetes mellitus with hyperglycemia, with long-term current use of insulin (HCC) were also pertinent  to this visit.     has a past medical history of Anxiety, Cancer (HCC), Depression, Diabetes mellitus (HCC), Heart murmur, Hyperlipidemia, Hypertension, Leukemia (HCC), and Thyroid disease.   has a past surgical history that includes Hysterectomy; back surgery; and CT BIOPSY DEEP BONE PERCUTANEOUS (07/24/2020).           Restrictions  Restrictions/Precautions  Restrictions/Precautions: Fall Risk,General Precautions  Required Braces or Orthoses?: No    Subjective   General  Chart Reviewed: Yes  Patient assessed for rehabilitation services?: Yes  Family / Caregiver Present: No  Subjective  Subjective: Pt pleasant and agreeable to therapy eval.  General Comment  Comments: per RN, pt Ok to see.  Patient Currently in Pain: Denies  Vital Signs  Patient Currently in Pain: Denies  Social/Functional History  Social/Functional History  Lives With: Alone  Type of Home: Apartment  Home Layout: One level  Home Access: Elevator  Bathroom Shower/Tub: Medical sales representative: Standard  Home Equipment: Rolling walker,Cane,4 wheeled walker  ADL Assistance: Independent  Homemaking Assistance: Independent  Homemaking Responsibilities: Yes  Ambulation Assistance: Independent (no AD)  Transfer Assistance: Independent  Active Driver: Yes       Objective   Vision: Within Functional Limits  Hearing: Within functional limits    Orientation  Overall Orientation Status: Within Functional Limits  Observation/Palpation  Posture: Good  Observation: dark red bruises on face and extremities. No lines or tubes  Balance  Sitting Balance: Supervision  Standing Balance: Stand by assistance  Standing Balance  Time: ~ 1 minute  Activity: static standing  Comment: pt stood at EOB with SBA and FWW.  Functional Mobility  Functional - Mobility Device: Rolling Walker  Activity: Other (moderate distance in hall)  Assist Level: Stand by assistance  Functional Mobility Comments: Pt walked in the hall with the FWW and SBA. Without AD, she required CGA d/t  increased unsteadiness.  ADL  Feeding: Modified independent   Grooming: Stand by assistance  UE Bathing: Stand by assistance  LE Bathing: Stand by assistance  UE Dressing: Stand by assistance  LE Dressing: Stand by assistance  Toileting: Stand by assistance  Additional Comments: Pt able to perform bathroom level toileting with SBA. SBA required for all ADls d/t balance deficits and decreased endurance.        Bed mobility  Supine to Sit: Stand by assistance  Sit to Supine: Unable to assess  Scooting: Stand by assistance  Comment: HOB elevated. No c/o dizziness. Pt seated in recliner at end of session.  Transfers  Sit to stand: Stand by assistance  Stand to sit: Stand by assistance  Transfer Comments: Pt able to perform transfers with SBA when using AD. CGA without AD.     Cognition  Overall Cognitive Status: WFL        Sensation  Overall Sensation Status: WFL (no c/o numbness or tingling)        LUE AROM (degrees)  LUE AROM : WFL  Left Hand AROM (degrees)  Left Hand AROM: WFL  RUE AROM (degrees)  RUE AROM : WFL  Right Hand AROM (degrees)  Right Hand AROM: WFL  LUE Strength  LUE Strength Comment: > 4/5 functionally observed  RUE Strength  RUE Strength Comment: > 4/5 functionally observed                   Plan   Plan  Times per week: 4 visits  Current Treatment Recommendations: Strengthening,Pain Management,Positioning,Safety Education & Training,Functional Mobility Training,Endurance Training,Balance Training,Patient/Caregiver Education & Training,Self-Care / ADL,Equipment Evaluation, Education, & procurement,Home Management Training  Plan Comment: POC and goals established in collaboration with pt.      AM-PAC Score        AM-PAC Inpatient Daily Activity Raw Score: 19 (08/17/20 1244)  AM-PAC Inpatient ADL T-Scale Score : 40.22 (08/17/20 1244)  ADL Inpatient CMS 0-100% Score: 42.8 (08/17/20 1244)  ADL Inpatient CMS G-Code Modifier : CK (08/17/20 1244)    Goals  Short term goals  Time Frame for Short term goals: 4  visits  Short term goal 1: Pt will perform full body ADls with Mod I.  Short term goal 2: pt will perform functional mobility and transfers with LRAD and Mod I.  Short term goal 3: Pt will perform toileting with mod I.  Patient Goals   Patient goals : to go home       Therapy Time   Individual Concurrent Group Co-treatment   Time In 1006         Time Out 1017 (co-eval with PT)         Minutes 11                 Benedetto Goad, OT

## 2020-08-17 NOTE — Progress Notes (Addendum)
Physical Therapy    Facility/Department: [facility]  Initial Assessment    NAME: [name]  DOB: 06-01-1945  MRN: [id number]   ??  Having reviewed the treatment plan and goals for this patient, I certify that the plan of care below is medically necessary and appropriate.  ??      Date of Service: 08/17/2020    Discharge Recommendations:  Home with Home health PT   PT Equipment Recommendations  Equipment Needed: No    Assessment   Body structures, Functions, Activity limitations: Decreased functional mobility ;Decreased balance;Decreased endurance;Decreased strength  Assessment: Patient did well with gait with FWW 100 ft x1 with SBA and transfers sit-stand with CGA thus will need skilled PT to address these impairments  Decision Making: Medium Complexity  History: Pt admitted to ED on 3/25 with fatigue and multiple bruises from a recent fall. Pts CTs and XRAYs negative for acute injuries. Pt recently dx with leukemia  Exam: AM-PAC  Clinical Presentation: This patient has multiple comorbities along with recent diagnosis leukemia thus will need skilled PT to address impaiments for  home  PT Education: Goals;PT Role;Transfer Training;Plan of Care;Gait Training;General Safety  Patient Education: Plan of care supervsion transffered to [employer] Dept physical therapist  REQUIRES PT FOLLOW UP: Yes  Activity Tolerance  Activity Tolerance: Patient Tolerated treatment well       Patient Diagnosis(es): The primary encounter diagnosis was Bruising. Diagnoses of Generalized weakness, Leukemia not having achieved remission, unspecified leukemia type (HCC), and Type 2 diabetes mellitus with hyperglycemia, with long-term current use of insulin (HCC) were also pertinent to this visit.     has a past medical history of Anxiety, Cancer (HCC), Depression, Diabetes mellitus (HCC), Heart murmur, Hyperlipidemia, Hypertension, Leukemia (HCC), and Thyroid disease.   has a past surgical history that includes Hysterectomy; back  surgery; and CT BIOPSY DEEP BONE PERCUTANEOUS (07/24/2020).    Restrictions  Restrictions/Precautions  Restrictions/Precautions: Fall Risk,General Precautions  Required Braces or Orthoses?: No  Vision/Hearing  Vision: Within Functional Limits  Hearing: Within functional limits     Subjective  General  Chart Reviewed: Yes  Patient assessed for rehabilitation services?: Yes  Family / Caregiver Present: No  Follows Commands: Within Functional Limits  Subjective  Subjective: Willing to participte  Pain Screening  Patient Currently in Pain: Denies  Pain Assessment  Pain Assessment: 0-10  Pain Level: 6  Pain Location: Leg;Knee  Pain Orientation: Right;Left  Vital Signs  Patient Currently in Pain: Denies       Orientation  Orientation  Overall Orientation Status: Within Functional Limits  Social/Functional History  Social/Functional History  Lives With: Alone  Type of Home: Apartment  Home Layout: Multi-level  Home Access: Level entry  Bathroom Shower/Tub: Paediatric nurse without back  Armed forces technical officer: Toilet raiser,Grab bars in Location manager: Environmental consultant accessible  Home Equipment: Grab bars  Receives Help From: [employer]  ADL Assistance: Independent  Homemaking Assistance: Independent  Homemaking Responsibilities: Yes  Ambulation Assistance: Independent  Transfer Assistance: Independent  Active Driver: Yes  Mode of Transportation: SUV  Education: NA  Occupation: Retired  Type of occupation: NA  Leisure & Hobbies: NA  IADL Comments: NA  Additional Comments: [facility] [place]  Cognition   Cognition  Overall Cognitive Status: WFL    Objective     Observation/Palpation  Posture: Good  Observation: dark red bruises on face and extremities. No lines or tubes    AROM RLE (degrees)  RLE AROM: WFL  AROM LLE (degrees)  LLE AROM : WFL  Strength RLE  Strength RLE: WFL  Comment: 4-/5  Strength LLE  Strength LLE: WFL  Comment: 4-/5  Tone RLE  RLE Tone: Normotonic  Tone LLE  LLE  Tone: Normotonic  Sensation  Overall Sensation Status: WFL  Bed mobility  Supine to Sit: Stand by assistance  Sit to Supine: Stand by assistance  Scooting: Stand by assistance  Comment: denies dizziness  Transfers  Sit to Stand: Contact guard assistance  Stand to sit: Contact guard assistance  Ambulation  Ambulation?: Yes  More Ambulation?: Yes  Ambulation 1  Surface: level tile  Device: Agricultural consultant  Assistance: Stand by assistance  Quality of Gait: reciprocal pattern mild forward posture  Gait Deviations: Decreased step length;Decreased step height  Distance: 100 ft  Comments: no LOB  Ambulation 2  Device 2: No device  Assistance 2: Contact guard assistance  Quality of Gait 2: mild foward posture reciprocal pattern knees slightly flexed no LOB mild unsteady  Gait Deviations: Decreased step length;Slow Cadence;Decreased step height  Distance: 67ft  Stairs/Curb  Stairs?: No     Balance  Posture: Good  Sitting - Static: Good  Sitting - Dynamic: Good;-  Standing - Static: Fair;+  Standing - Dynamic: Fair;+        Plan   Plan  Times per week: 5 vsits  Times per day: Daily  Current Treatment Recommendations: Strengthening,Transfer Training,Endurance Training,ROM,Balance Training,Gait Training,Safety Education & Training,Patient/Caregiver Education & Training,Functional Mobility Training  Plan Comment: Plan of care supervsion transferred to [employer] [facility] Dept phsical therapist  Safety Devices  Type of devices: Gait belt,Call light within reach,Positioning belt,Patient at risk for falls,Left in bed,Bed alarm in place,Chair alarm in place  Restraints  Initially in place: No    AM-PAC Score  AM-PAC Inpatient Mobility Raw Score : 19 (08/17/20 1356)  AM-PAC Inpatient T-Scale Score : 45.44 (08/17/20 1356)  Mobility Inpatient CMS 0-100% Score: 41.77 (08/17/20 1356)  Mobility Inpatient CMS G-Code Modifier : CK (08/17/20 1356)          Goals  Short term goals  Time Frame for Short term goals: 5 VISITS  Short term goal 1:  Perform BLE AROM 3sets.10reps to increase function  Short term goal 2: Bed moblity supine-sit with md I  Short term goal 3: Tranfers sit-stand with supevsion  Short term goal 4: Ambulaute with fww 150 ft x 2 with supervsion  Patient Goals   Patient goals : WALK       Therapy Time   Individual Concurrent Group Co-treatment   Time In 1023 (CO-EVAL OT)         Time Out 1035         Minutes 12                 Toma Deiters, PT

## 2020-08-17 NOTE — H&P (Signed)
PATIENT:                  [name], [name]  MEDICAL RECORD #:         [id number]  ADMISSION DATE:           08/16/2020  ACCOUNT #:                [id number]  DATE OF BIRTH:            Jan 30, 1946  AGE:                      75  ADMITTING PHYSICIAN:      Jodene Nam, MD  ATTENDING PHYSICIAN:      Jodene Nam, MD  DICTATING PHYSICIAN:      Jodene Nam, MD                             HISTORY AND PHYSICAL          CHIEF COMPLAINT:  Bruising and abnormally high glucose.    HISTORY OF PRESENT ILLNESS:  This 75 year old patient of Dr. Dutch Quint,  who has just recently been diagnosed with leukemia, had been seen a  few times by Dr. Gwenith Daily.  Apparently, he saw her yesterday covered with  bruises and also elicited a history of falls.  I noted that she was on  Eliquis and thought that her mental status was altered and send her to  the emergency room for evaluation.  She was found to have a glucose of  523, so there was no way she was being allowed to go home.  She did  have bruises on her face around her left eye and left chin and upper  neck, also on her arms.  She acknowledged that she fallen, but did not  recall hitting her head.  CAT scan of the head was unremarkable.  We  did x-rays of her femur and hip, no fractures were seen there.  She  was given insulin in the emergency room and we reestablished her  medications at home and overnight her glucose has came down until they  were in the mid 200s this morning.  She does not feel any different.  Unfortunately, her white blood count was 125 yesterday up from her  baseline 100 and today even higher 130.  Consult will be placed to  Hematology-Oncology, so they can follow her while she is here.  Question is should she still be on Eliquis if she has got all these  bruises after a fall.  Also the patient admits being noncompliant with  her diabetic management at home.  She is not following a diet.  She  does not check her sugars.  She is on a sliding scale with  meals but  obviously is not using it if she is not checking her sugars.    PAST MEDICAL HISTORY:  Diabetes mellitus, anxiety, depression, heart  murmur, hyperlipidemia, hypertension, thyroid disease, and newly  diagnosed leukemia.    PAST SURGICAL HISTORY:  Includes a CT biopsy of her pelvic bone to  find the leukemia and also hysterectomy and back surgery.    ALLERGIES:  SHE IS ALLERGIC TO CODEINE.    FAMILY HISTORY:  Positive for stroke, diabetes and heart disease.    SOCIAL HISTORY:  She is a nonsmoker, nondrinker.  Does not use drugs.    MEDICATIONS:  Her medications currently  include Tylenol 650 every 6  hours as needed, allopurinol 300 mg nightly, amlodipine 5 mg daily,  Eliquis 5 mg twice daily, Lipitor 80 mg nightly, Flexeril 10 mg 3  times daily as needed, Flonase nasal spray one spray each nostril  daily, gabapentin 100 mg daily, glipizide 5 mg before breakfast,  p.r.n. Glucagon and Glutose, Gleevec 400 mg nightly, Lantus 60 units  nightly, sliding scale Humalog, levothyroxine 88 mcg daily, Tradjenta  5 mg daily, lisinopril 20 mg daily, metoprolol tartrate 25 mg twice  daily, p.r.n. Zofran, p.r.n. oxycodone 5 mg every 4 hours as needed,  MiraLAX 17 g daily as needed, Ozempic 0.25 mg weekly, Zoloft 100 mg  daily.    REVIEW OF SYSTEMS:  Review of systems is as above.    PHYSICAL EXAMINATION:    GENERAL:  Well-developed, well-nourished, older white female, who is  covered in bruises.    VITAL SIGNS:  Include a temperature of 96.9, pulse 79, respirations  16, blood pressure 161/60, oxygen 96%.    HEENT:  Head normocephalic.  There are signs of trauma.  She has  bruising around her left eye and the left side of her chin and jaw.    NECK:  No JVD, bruits, or thyromegaly.    LYMPHATICS:  None noted.    SKIN:  Warm and dry.  She has significant bruises on her upper arms.    BACK:  Without lesions.    LUNGS:  Clear.    CHEST:  Symmetrical.    BREASTS:  Not examined.    HEART:  Regular rate and rhythm without  murmur, gallop, or rub.    ABDOMEN:  Soft, nontender, nondistended without hepatomegaly or  masses.  Bowel sounds normoactive.    GENITAL/PELVIC/RECTAL/URINARY:  Deferred.    NEUROLOGICAL:  Normal and nonfocal.    EXTREMITIES:  No cyanosis, clubbing, or edema, but she does have areas  of tenderness and bruising on her arms especially near her right  elbow.    LABORATORY STUDIES:  Laboratories include a sodium and potassium _____  and 4.0.  BUN and creatinine 25 and 0.25.  Glucose this morning 245,  down from 523 yesterday in the ER.  White count 130,000.  Her  hemoglobin is 10.1, platelets 200,000.    DIAGNOSTIC STUDIES:  X-rays of the hips and femur showed no  fractures.    IMPRESSION:  1.    MULTIPLE BRUISES FOLLOWING A FALL WITH THE PATIENT IS ON        ELIQUIS.  2.    NEW ONSET LEUKEMIA WITH ARISING WHITE BLOOD COUNT DESPITE        STARTED TREATMENT.  3.    ATRIAL FIBRILLATION.  Anticoagulated on Eliquis and rate        maintained on metoprolol tartrate.    PLAN:  We will have her seen by Hematology-Oncology to see if the  excessive bruising has anything to do with her new onset leukemia or  is primarily from taking Eliquis.  I will stress better compliance  with diabetic management.  Manage her on her orals and insulins here.  Further management depends on the patient's course and recommendations  from the specialist.    Diskriter Job ID: [id number]      DOD:08/17/2020 11:38 A  RHG/dsk  DOT:08/17/2020 02:42 P  Job Number: [id number]  Document Number: [id number]

## 2020-08-17 NOTE — Other (Unsigned)
Patient Acct Nbr: [id number]   Primary AUTH/CERT: ""  Primary Insurance Company Name: Company secretary Plan name: [employer] [facility] Medicare  Primary Insurance Group Number: [id number]  Primary Insurance Plan Type: Health  Primary Insurance Policy Number: [id number]

## 2020-08-17 NOTE — H&P (Signed)
[id number]

## 2020-08-18 LAB — CBC WITH AUTO DIFFERENTIAL
Hematocrit: 31.6 % — ABNORMAL LOW (ref 35.0–47.0)
Hemoglobin: 10 g/dL — ABNORMAL LOW (ref 11.7–16.0)
MCH: 28.5 pg (ref 26.0–34.0)
MCHC: 31.7 % — ABNORMAL LOW (ref 32.0–36.0)
MCV: 90 fL (ref 79.0–98.0)
MPV: 10.2 fL (ref 7.4–10.4)
Platelets: 201 10*3/uL (ref 140–440)
RBC: 3.51 10*6/uL — ABNORMAL LOW (ref 3.80–5.20)
RDW: 18.6 % — ABNORMAL HIGH (ref 11.5–14.5)
WBC: 80.1 10*3/uL (ref 3.6–10.7)

## 2020-08-18 LAB — MANUAL DIFFERENTIAL
Absolute Baso #: 0.8 10*3/uL — ABNORMAL HIGH (ref 0.0–0.2)
Absolute Eos #: 2.4 10*3/uL — ABNORMAL HIGH (ref 0.0–0.5)
Absolute Lymph #: 3.2 10*3/uL (ref 1.1–4.5)
Absolute Mono #: 1.6 10*3/uL — ABNORMAL HIGH (ref 0.2–1.1)
Absolute Neut #: 62.5 10*3/uL — ABNORMAL HIGH (ref 2.2–8.2)
Bands: 12 % — ABNORMAL HIGH (ref 0–3)
Basophils: 1 % (ref 0–2)
Eosinophils: 3 % (ref 1–6)
Lymphocytes: 4 % — ABNORMAL LOW (ref 20–40)
Metamyelocytes: 6 % — AB (ref <1)
Monocytes: 2 % (ref 2–10)
Myelocytes: 3 % — AB (ref <1)
Promyelocytes: 3 % — AB (ref <1)
RBC Morphology: ABNORMAL NA
Seg Neutrophils: 66 % (ref 40–80)
TOTAL CELLS COUNTED: 100 NA
nRBC: 1 /100{WBCs} — ABNORMAL HIGH (ref <=0)

## 2020-08-18 LAB — COMPREHENSIVE METABOLIC PANEL
ALT: 43 U/L — ABNORMAL HIGH (ref 0–34)
AST: 70 U/L — ABNORMAL HIGH (ref 15–46)
Albumin,Serum: 3.2 g/dL — ABNORMAL LOW (ref 3.5–5.0)
Alkaline Phosphatase: 117 U/L (ref 38–126)
Anion Gap: 7 mmol/L (ref 3–13)
BUN: 34 mg/dL — ABNORMAL HIGH (ref 9–20)
CO2: 24 mmol/L (ref 22–30)
Calcium: 8 mg/dL — ABNORMAL LOW (ref 8.4–10.4)
Chloride: 98 mmol/L (ref 98–107)
Creatinine: 1.05 mg/dL (ref 0.52–1.25)
EGFR IF NonAfrican American: 52.2 mL/min — AB (ref >60)
Glucose: 253 mg/dL — ABNORMAL HIGH (ref 70–100)
Potassium: 4.1 mmol/L (ref 3.5–5.1)
Sodium: 130 mmol/L — ABNORMAL LOW (ref 135–145)
Total Bilirubin: 1.2 mg/dL (ref 0.2–1.3)
Total Protein: 6.7 g/dL (ref 6.3–8.2)
eGFR African American: 60.5 mL/min (ref >60)

## 2020-08-18 LAB — POCT GLUCOSE
POC Glucose: 163 mg/dL — ABNORMAL HIGH (ref 70–100)
POC Glucose: 239 mg/dL — ABNORMAL HIGH (ref 70–100)
POC Glucose: 310 mg/dL — ABNORMAL HIGH (ref 70–100)
POC Glucose: 330 mg/dL — ABNORMAL HIGH (ref 70–100)

## 2020-08-18 LAB — LACTATE DEHYDROGENASE: LD: 363 U/L — ABNORMAL HIGH (ref 120–246)

## 2020-08-18 LAB — URIC ACID: Uric Acid: 3.8 mg/dL (ref 3.5–8.5)

## 2020-08-18 MED ORDER — INSULIN LISPRO 100 UNIT/ML SC SOLN
100 UNIT/ML | Freq: Every evening | SUBCUTANEOUS | Status: DC
Start: 2020-08-18 — End: 2020-08-19
  Administered 2020-08-19: 01:00:00 via SUBCUTANEOUS

## 2020-08-18 MED ORDER — INSULIN LISPRO 100 UNIT/ML SC SOLN
100 UNIT/ML | Freq: Three times a day (TID) | SUBCUTANEOUS | Status: DC
Start: 2020-08-18 — End: 2020-08-19
  Administered 2020-08-18 – 2020-08-19 (×3): via SUBCUTANEOUS

## 2020-08-18 MED FILL — ALLOPURINOL 300 MG PO TABS: 300 MG | ORAL | Qty: 1

## 2020-08-18 MED FILL — LISINOPRIL 20 MG PO TABS: 20 MG | ORAL | Qty: 1

## 2020-08-18 MED FILL — LANTUS 100 UNIT/ML SC SOLN: 100 UNIT/ML | SUBCUTANEOUS | Qty: 1

## 2020-08-18 MED FILL — GLIPIZIDE 5 MG PO TABS: 5 MG | ORAL | Qty: 1

## 2020-08-18 MED FILL — OXYCODONE HCL 5 MG PO TABS: 5 MG | ORAL | Qty: 1

## 2020-08-18 MED FILL — LEVOTHYROXINE SODIUM 88 MCG PO TABS: 88 MCG | ORAL | Qty: 1

## 2020-08-18 MED FILL — METOPROLOL TARTRATE 25 MG PO TABS: 25 MG | ORAL | Qty: 1

## 2020-08-18 MED FILL — SERTRALINE HCL 100 MG PO TABS: 100 MG | ORAL | Qty: 1

## 2020-08-18 MED FILL — ATORVASTATIN CALCIUM 80 MG PO TABS: 80 MG | ORAL | Qty: 1

## 2020-08-18 MED FILL — AMLODIPINE BESYLATE 5 MG PO TABS: 5 MG | ORAL | Qty: 1

## 2020-08-18 MED FILL — CYCLOBENZAPRINE HCL 10 MG PO TABS: 10 MG | ORAL | Qty: 1

## 2020-08-18 MED FILL — GABAPENTIN 100 MG PO CAPS: 100 MG | ORAL | Qty: 1

## 2020-08-18 MED FILL — TYLENOL 325 MG PO TABS: 325 MG | ORAL | Qty: 2

## 2020-08-18 MED FILL — TRADJENTA 5 MG PO TABS: 5 MG | ORAL | Qty: 1

## 2020-08-18 NOTE — Plan of Care (Signed)
Problem: Falls - Risk of:  Goal: Will remain free from falls  Description: Will remain free from falls  Outcome: Met This Shift     Problem: Pain:  Goal: Pain level will decrease  Description: Pain level will decrease  Outcome: Met This Shift

## 2020-08-18 NOTE — Discharge Instructions (Signed)
***********************************************************************  ************************************************************************      Your physician has ordered skilled home care services for you.                   Your home care will be provided by:                       [facility]                                  [phone]    SCHEDULING   [phone]  ********************************************************************  **********************************************************************

## 2020-08-18 NOTE — Consults (Signed)
PATIENTKEBRA, [name]  MEDICAL RECORD NUMBER:        [id number]  ADMISSION DATE:         08/16/2020  DATE  OF SERVICE:       08/18/2020  ACCOUNT#:               [id number]  HOSPITAL SERVICE:  DATE OF BIRTH:          [birth year]  AGE:                    75  ADMITTING PHYSICIAN:    Cecelia Byars, MD  ATTENDING PHYSICIAN:    Cecelia Byars, MD  DICTATING PHYSICIAN:    Wonda Olds, MD                                 CONSULTATION      PATIENT OF:  Dr. Mervyn Gay, currently 150, bed 1.    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  The patient  was started on oral TKI on August 08, 2020.  The patient was seen in  the office on August 16, 2020 with history of fall, evidence of  multiple contusions, most prominently left mandible.  The patient was  noted to have some mental status changes.  Sent to the ER for  evaluation.  Noted to have a blood sugar of 523.  The patient  currently admitted for evaluation and treatment.    PAST MEDICAL HISTORY:  Significant for history of atrial fibrillation,  chronic Eliquis use.  The patient in addition to diabetes, has  anxiety, depression, hyperlipidemia, hypertension, thyroid disease.    PAST SURGICAL HISTORY:  Includes recent bone marrow aspiration and biopsy,  hysterectomy, back surgery.  The patient denies any bleeding or  thrombotic events.  The patient denies any antecedent history of  thrombophilia.    SOCIAL HISTORY:  The patient currently does not use alcohol or  tobacco.    MEDICATIONS:  The patient's medications include Gleevec 400 mg daily,  allopurinol 300 mg daily.  The patient currently denies any active  bleeding diathesis including hemoptysis, hematemesis, bright red blood  per rectum, melanotic stool, hematuria.    REVIEW OF SYSTEMS:  Otherwise, the patient's review of systems  is  negative for 10 systems.    FAMILY PSYCH HISTORY:  The patient is functional in the home  environment, assistance of caretaker family members.    PHYSICAL EXAMINATION:  Objective examination shows a middle-aged white  female, morbidly obese, appearing older than stated age, alert, but  some confusion.  HEENT:  Without adenopathy.  Lungs show bibasilar  crackles.  Heart shows an S1 and S2.  The patient is currently normal  sinus rhythm.  Abdomen is soft, distended.  No palpable organomegaly.  Groin/Fem:  Without adenopathy.  Extremities show chronic stasis  changes.  Skin shows aforementioned multiple contusions, most  prominent left mandible.  Neurological:  Fails to reveal any focal  neurological sign.    LABORATORY STUDIES:  Labs on admission showed hemoglobin of 10.8,  hematocrit 34.7, white count 1,26,300.  Differential showed 87.1 segs,  11 bands, 7.6 lymphocytes with evidence of eosinophil and basophilia.  The patient had evidence  of poikilocytosis, polychromasia, teardrop  cells, moderate anisocytosis, slight hypochromasia ovalocytes.  The  patient did have 2 blasts on peripheral smear.  PT 13, INR 1.2, PTT  37.3.  Urinalysis, glucose greater than a 1000, urobilinogen is  normal, 6-10 WBCs, leukocytes are negative.  Specific gravity greater  than 1.030.    DIAGNOSTIC STUDIES:  Head CAT scan without contrast shows no evidence  of intracranial abnormality, specifically no intracranial bleed.  X-ray of right hip shows extensive spondylosis with postsurgical spine  changes.  X-ray of the right femur shows no acute right hip joint or  femur process.  EKG shows sinus rhythm, left axis deviation.    IMPRESSION AND PLAN:  MIDDLE-AGED WHITE FEMALE WITH HISTORY OF FALL,  NOW WITH EVIDENCE OF MULTIPLE CONTUSIONS, HISTORY OF UNCONTROLLED  DIABETES, NEW ONSET OF CHRONIC MYELOGENOUS LEUKEMIA.  Association of  interaction between [place] and Eliquis such that Gleevec potentiate  the action of Eliquis.  Given the fact  that the patient is currently in  normal sinus rhythm, will discontinue Eliquis at this time.  Otherwise, will continue Gleevec: too short course to comment on the  success or failure of Gleevec.  Otherwise, concur with current  measures, especially with respect to stabilization of diabetic process.  Extensive discussion with the patient and RN staff.  Will continue   to monitor virtually for the immediate future.  Otherwise, concur  with current measures.  The patient continues to represent a case of  high complexity medical decision making.    Diskriter Job ID: [id number]          Wonda Olds, MD          DOD:08/18/2020 10:21 A  AJH/dsk  DOT:08/18/2020 01:35 P  Document number: [id number]  Job Number: [id number]  cc:

## 2020-08-18 NOTE — IP Home Care (Signed)
Home Health Referral:  Educated patient on Home Care and services available. Patient offered choice of available HHC agencies and agreeable to SN/PT/MSW services with [facility].     Patient Demographics   PLEASE CALL [phone] (H) TO SCHEDULE     Name: [name]    Patient Demographics      [address]  [place] [address]   PCP LISA Jearld Fenton, DO     Language/ Communication Barrier  No       Phone [phone] (home)      Cell Phone           Emergency Contacts  Extended Emergency Contact Information  Primary Emergency Contact: Dull,[name]   [place]  Home Phone: [phone]  Relation: Brother/Sister              Care Types: HYPERGLYCEMIA      Pt Home Health goal: TO REMAIN AT HOME     Is a [facility] Call Needed: YES    Ordering Physician: Darden Dates, MD    Following Physician: Olegario Messier, DO   Agreeable to Follow: YES RECENT VISIT     Primary Diagnosis & Reason for Services: Bruising [T14.8XXA]  Hyperglycemia [R73.9]  Generalized weakness [R53.1]  Type 2 diabetes mellitus with hyperglycemia, with long-term current use of insulin (HCC) [E11.65, Z79.4]  Leukemia not having achieved remission, unspecified leukemia type (HCC) [C95.90]    Hi-tech (labs, wounds, infusions, etc): N/A     Additional Comments:   PLACED IN CARE PORT       Discharge Date: TBD     Referral Source-PACC: (Hospital/Unit):[facility]   Electronically signed by Rubbie Battiest, LPN on 6/81/1572 at 6:01 PM

## 2020-08-18 NOTE — Progress Notes (Signed)
Comprehensive Nutrition Assessment    Type and Reason for Visit:  Initial,Positive Nutrition Screen    Nutrition Recommendations/Plan:     1.  Patient reports mild intolerances to eggs and flour- she does consume these products- per MNT protocol, removed from allergy list in EPIC    2. Per MNT protocol, liberalized diet to: Regular- 5 Carbohydrate choices/75 g CHO per meal --> this will allow patient to meet increased nutrition needs    At this time declined ONS   Encourage patient to order three well-balance meals daily, with nutrient dense foods   Goal is for patient to maintain weight at this time    3. Please record % meals consumed in flow-sheet for most accurate nutrient intake assessment.    4. RDN to continue to monitor weekly changes in: fluid accumulation, weight, skin integrity, trends in lab values, oral intake tolerance, clinical status, discharge planning and goals of care    Nutrition Assessment:  75 year old woman with PMHx: anxiety and depression, HLD, HTN, AFib (on eliquis), DMII(A1C=10.3% on 08/17/20), and new leukemia diagnosis. She was sent to the ED by Dr Gwenith Daily for fatigue and concern that she was ???off??? after a mechanical fall that occurred 2 days PTA (on 3/23).  Patient denies striking her head or LOC. In ED, head CT showed: ???evidence of acute intracranial abnormality???; Xray of lumbar spine showed: ???Extensive spondylosis with post surgical spine changes???; Xray of high hip and femur showed: ???No acute right hip joint or femur process. +Osteoarthritis changes???.  Labs on admit: WBC(126.3), Hb(10.8), Hct(34.7), BUN(22), Na+(132), elevated LFTs, INR(1.2), BGL(523). Hem/Onc following. At time of visit, patient resting in bed and recently just ordered lunch.  Recalls eating: ???eggs, potatoes, and cottage cheese on the side??? for breakfast, and she consumed 100% of her tray.  Denies: pain, nausea, GI distress, or difficulty with chewing or swallowing.  Does report she will occasionally ???get choked on  something???, not on a consistent basis.  She ???tries??? to eat three meals daily- will on occasion [place] ONS for meal replacement.  Declined ONS at this time.  Bed scale weight obtained at 163.3#, and she further endorses intentional weight loss of ???twenty pounds since January???.  Confirmed listed egg and flour ???allergies??? are intolerances.  Patient does eat food products that flour and eggs are cooked into (as evidenced by breakfast this morning).  States if she consumes ???eggs by themselves, I feel like I have the flu??? and I think flour gives me a headache???. Denies following a gluten free diet.  Removed in EPIC as these foods are not true allergies    Malnutrition Assessment:  Malnutrition Status:  At risk for malnutrition (Comment) (Reports no change in appetite or intake PTA.  Did not significant weight loss over the past three months, Melena reports intentional weight loss of ~20#. No fat wasting and minimal muscle wasting during NFPE. Will continue to monitor)    Context:  Chronic Illness     Findings of the 6 clinical characteristics of malnutrition:  Energy Intake:  No significant decrease in energy intake  Weight Loss:  7 - Greater than 7.5% over 3 months (-14.1% weight loss x 3 months per EMR- patient reports intentional weight loss of ~20#)     Body Fat Loss:  No significant body fat loss     Muscle Mass Loss:  1 - Mild muscle mass loss Temples (temporalis)  Fluid Accumulation:  No significant fluid accumulation    Grip Strength:  Not Performed  Estimated Daily Nutrient Needs:  Energy (kcal):  1628-1998 (22-27 kcal/kg CBW); Weight Used for Energy Requirements:  Current (74 kg)     Protein (g):  57-85 (1.0-1.5 g protein/kg IBW); Weight Used for Protein Requirements:  Ideal (56.7 kg)        Fluid (ml/day):  1700-2000 mL/day or per MD; Method Used for Fluid Requirements:  1 ml/kcal      Nutrition Related Findings:  No skin break down noted, Braden score=20. +bruising. +trace BLE. Meds: neurontin, insulin, synthroid,  lisinopril, zoloft. Labs: WBC(80.1), LD(363), BUN(34), ALT(43), AST(70), BGL(221->238->330->239)      Wounds:  None       Current Nutrition Therapies:    ADULT DIET; Regular; 5 carb choices (75 gm/meal)    Anthropometric Measures:  ?? Height: 5\' 5"  (165.1 cm)  ?? Current Body Weight: 163 lb 4.8 oz (74.1 kg) (08/18/20)   ?? Admission Body Weight:  (NA)    ?? Usual Body Weight: 190 lb (86.2 kg) (noted 06/12/19)     ?? Ideal Body Weight: 125 lbs; % Ideal Body Weight 130.6 %   ?? BMI: 27.2  ?? BMI Categories: Overweight (BMI 25.0-29.9)       Nutrition Diagnosis:   ?? Increased nutrient needs related to catabolic illness as evidenced by  (leukemia)    ?? Impaired nutrient utilization related to endocrine dysfuntion as evidenced by lab values    Nutrition Interventions:   Food and/or Nutrient Delivery:  Modify Current Diet,Vitamin Supplement,Mineral Supplement (Regular- 5 CC/75 g CHO daily diet d/t increased needs)  Nutrition Education/Counseling:  Education not indicated   Coordination of Nutrition Care:  Continue to monitor while inpatient    Goals:  Patient to meet > 75% estimated needs       Nutrition Monitoring and Evaluation:   Behavioral-Environmental Outcomes:  None Identified   Food/Nutrient Intake Outcomes:  Food and Nutrient Intake,Diet Advancement/Tolerance  Physical Signs/Symptoms Outcomes:  Chewing or Swallowing,Fluid Status or Edema,Weight,Hemodynamic Status,Skin,Nutrition Focused Physical Findings,Nausea or Vomiting,GI Status,Meal Time Behavior     Discharge Planning:    Too soon to determine     Electronically signed by 03-27-2001, RD, LD on 08/18/20 at 1:49 PM EDT    Contact: [phone]

## 2020-08-18 NOTE — Progress Notes (Signed)
Consult dictated.  Patient has been taken off Eliquis given the potential for increasing effect of anticoagulant with the introduction of Gleevec, and the fact that patient currently in NSR.  WBC down to 80.1 K - Good Sign.  LDH 363  Uric Acid 3.8  Will continue to monitor.

## 2020-08-18 NOTE — Progress Notes (Signed)
Progress Note  08/18/2020 12:15 PM  Subjective:   Admit Date: 08/16/2020  PCP: Olegario Messier, DO  Interval History: Seen by Dr Gwenith Daily. Eliquis d/c'd due to interaction w Gleevec, pt remains in SR. Her WBC has dropped to 80K.  ADULT DIET; Regular; 5 carb choices (75 gm/meal)    Intake/Output Summary (Last 24 hours) at 08/18/2020 1215  Last data filed at 08/18/2020 0907  Gross per 24 hour   Intake 5 ml   Output 650 ml   Net -645 ml     Medications:     ??? sodium chloride     ??? dextrose       ??? insulin lispro  0-18 Units SubCUTAneous TID WC   ??? insulin lispro  0-9 Units SubCUTAneous Nightly   ??? allopurinol  300 mg Oral Nightly   ??? amLODIPine  5 mg Oral Daily   ??? atorvastatin  80 mg Oral Nightly   ??? fluticasone  1 spray Each Nostril Daily   ??? gabapentin  100 mg Oral Daily   ??? glipiZIDE  5 mg Oral QAM AC   ??? imatinib  400 mg Oral Nightly   ??? levothyroxine  88 mcg Oral Daily   ??? lisinopril  20 mg Oral Daily   ??? metoprolol tartrate  25 mg Oral BID   ??? sertraline  100 mg Oral Daily   ??? sodium chloride flush  5-40 mL IntraVENous 2 times per day   ??? linagliptin  5 mg Oral Daily   ??? insulin glargine  60 Units SubCUTAneous Daily   ??? Semaglutide(0.25 or 0.5MG /DOS)  0.25 mg SubCUTAneous Weekly     Recent Labs     08/16/20  1314 08/17/20  0433 08/18/20  0445   WBC 126.3* 130.0* 80.1*   HGB 10.8* 10.1* 10.0*   PLT 195 200 201     Recent Labs     08/16/20  1314 08/17/20  0433 08/18/20  0445   NA 132* 133* 130*   K 4.2 4.0 4.1   CL 102 101 98   CO2 20* 23 24   BUN 22* 25* 34*   CREATININE 0.91 0.89 1.05   GLUCOSE 523* 245* 253*     Recent Labs     08/16/20  1314 08/17/20  0433 08/18/20  0445   AST 52* 64* 70*   ALT 43* 42* 43*   BILITOT 1.1 1.1 1.2   ALKPHOS 158* 117 117       Objective:   Vitals: BP 137/65    Pulse 75    Temp 98.2 ??F (36.8 ??C) (Temporal)    Resp 18    Ht 5\' 5"  (1.651 m)    SpO2 97%    BMI 31.62 kg/m??   General appearance: alert and cooperative with exam  Lungs: clear to auscultation bilaterally  Heart: regular rate and  rhythm, S1, S2 normal, no murmur, click, rub or gallop  Abdomen: soft, non-tender; bowel sounds normal; no masses,  no organomegaly  Extremities: extremities normal, atraumatic, no cyanosis or edema  Neurologic: No obvious focal neurologic deficits.  Skin- multiple areas of ecchymoses on arms back, leg, face, neck  Assessment and Plan:   1. IDDM with severe hyperglycemia, poor compliance at home  2. New onset leukemia with high wbc  3. Hx of afib and anticoag with Eliquis, now on hold  4. HPL  5. HTN  6. Hypothyroid    Advance Directive: Full Code  DVT prophylaxis scd's. Off Eliquis  Discharge  planning: home    Active Problems:    Hyperglycemia  Resolved Problems:    * No resolved hospital problems. *      Darden Dates, MD, MD

## 2020-08-19 LAB — MANUAL DIFFERENTIAL
Absolute Baso #: 1.3 10*3/uL — ABNORMAL HIGH (ref 0.0–0.2)
Absolute Eos #: 1.9 10*3/uL — ABNORMAL HIGH (ref 0.0–0.5)
Absolute Lymph #: 3.9 10*3/uL (ref 1.1–4.5)
Absolute Mono #: 0.6 10*3/uL (ref 0.2–1.1)
Absolute Neut #: 51.5 10*3/uL — ABNORMAL HIGH (ref 2.2–8.2)
Bands: 5 % — ABNORMAL HIGH (ref 0–3)
Basophils: 2 % (ref 0–2)
Eosinophils: 3 % (ref 1–6)
Lymphocytes: 6 % — ABNORMAL LOW (ref 20–40)
Metamyelocytes: 5 % — AB (ref <1)
Monocytes: 1 % — ABNORMAL LOW (ref 2–10)
Myelocytes: 2 % — AB (ref <1)
Promyelocytes: 1 % — AB (ref <1)
RBC Morphology: ABNORMAL NA
Seg Neutrophils: 75 % (ref 40–80)
TOTAL CELLS COUNTED: 100 NA

## 2020-08-19 LAB — COMPREHENSIVE METABOLIC PANEL
ALT: 63 U/L — ABNORMAL HIGH (ref 0–34)
AST: 99 U/L — ABNORMAL HIGH (ref 15–46)
Albumin,Serum: 3.4 g/dL — ABNORMAL LOW (ref 3.5–5.0)
Alkaline Phosphatase: 137 U/L — ABNORMAL HIGH (ref 38–126)
Anion Gap: 7 mmol/L (ref 3–13)
BUN: 36 mg/dL — ABNORMAL HIGH (ref 9–20)
CO2: 25 mmol/L (ref 22–30)
Calcium: 8.4 mg/dL (ref 8.4–10.4)
Chloride: 101 mmol/L (ref 98–107)
Creatinine: 0.96 mg/dL (ref 0.52–1.25)
EGFR IF NonAfrican American: 58.1 mL/min — AB (ref >60)
Glucose: 111 mg/dL — ABNORMAL HIGH (ref 70–100)
Potassium: 3.9 mmol/L (ref 3.5–5.1)
Sodium: 133 mmol/L — ABNORMAL LOW (ref 135–145)
Total Bilirubin: 1.1 mg/dL (ref 0.2–1.3)
Total Protein: 6.9 g/dL (ref 6.3–8.2)
eGFR African American: 67.4 mL/min (ref >60)

## 2020-08-19 LAB — CBC WITH AUTO DIFFERENTIAL
Hematocrit: 32.4 % — ABNORMAL LOW (ref 35.0–47.0)
Hemoglobin: 10.4 g/dL — ABNORMAL LOW (ref 11.7–16.0)
MCH: 28.8 pg (ref 26.0–34.0)
MCHC: 32.1 % (ref 32.0–36.0)
MCV: 89.9 fL (ref 79.0–98.0)
MPV: 9.6 fL (ref 7.4–10.4)
Platelets: 230 10*3/uL (ref 140–440)
RBC: 3.6 10*6/uL — ABNORMAL LOW (ref 3.80–5.20)
RDW: 18.7 % — ABNORMAL HIGH (ref 11.5–14.5)
WBC: 64.4 10*3/uL (ref 3.6–10.7)

## 2020-08-19 LAB — POCT GLUCOSE
POC Glucose: 105 mg/dL — ABNORMAL HIGH (ref 70–100)
POC Glucose: 145 mg/dL — ABNORMAL HIGH (ref 70–100)
POC Glucose: 296 mg/dL — ABNORMAL HIGH (ref 70–100)

## 2020-08-19 LAB — LACTATE DEHYDROGENASE: LD: 380 U/L — ABNORMAL HIGH (ref 120–246)

## 2020-08-19 LAB — URIC ACID: Uric Acid: 4 mg/dL (ref 3.5–8.5)

## 2020-08-19 MED ORDER — INSULIN LISPRO 100 UNIT/ML SC SOLN
100 UNIT/ML | Freq: Three times a day (TID) | SUBCUTANEOUS | 3 refills | Status: AC
Start: 2020-08-19 — End: ?

## 2020-08-19 MED ORDER — LEVOTHYROXINE SODIUM 88 MCG PO TABS
88 MCG | ORAL_TABLET | Freq: Every day | ORAL | 3 refills | Status: AC
Start: 2020-08-19 — End: ?

## 2020-08-19 MED ORDER — INSULIN LISPRO 100 UNIT/ML SC SOLN
100 UNIT/ML | Freq: Every evening | SUBCUTANEOUS | 3 refills | Status: AC
Start: 2020-08-19 — End: ?

## 2020-08-19 MED FILL — METOPROLOL TARTRATE 25 MG PO TABS: 25 MG | ORAL | Qty: 1

## 2020-08-19 MED FILL — ALLOPURINOL 300 MG PO TABS: 300 MG | ORAL | Qty: 1

## 2020-08-19 MED FILL — LEVOTHYROXINE SODIUM 88 MCG PO TABS: 88 MCG | ORAL | Qty: 1

## 2020-08-19 MED FILL — GLIPIZIDE 5 MG PO TABS: 5 MG | ORAL | Qty: 1

## 2020-08-19 MED FILL — SERTRALINE HCL 100 MG PO TABS: 100 MG | ORAL | Qty: 1

## 2020-08-19 MED FILL — TRADJENTA 5 MG PO TABS: 5 MG | ORAL | Qty: 1

## 2020-08-19 MED FILL — ATORVASTATIN CALCIUM 80 MG PO TABS: 80 MG | ORAL | Qty: 1

## 2020-08-19 MED FILL — GABAPENTIN 100 MG PO CAPS: 100 MG | ORAL | Qty: 1

## 2020-08-19 MED FILL — OXYCODONE HCL 5 MG PO TABS: 5 MG | ORAL | Qty: 1

## 2020-08-19 MED FILL — LANTUS 100 UNIT/ML SC SOLN: 100 UNIT/ML | SUBCUTANEOUS | Qty: 1

## 2020-08-19 MED FILL — LISINOPRIL 20 MG PO TABS: 20 MG | ORAL | Qty: 1

## 2020-08-19 MED FILL — AMLODIPINE BESYLATE 5 MG PO TABS: 5 MG | ORAL | Qty: 1

## 2020-08-19 MED FILL — CYCLOBENZAPRINE HCL 10 MG PO TABS: 10 MG | ORAL | Qty: 1

## 2020-08-19 NOTE — Care Coordination-Inpatient (Unsigned)
Admission Date: 08/16/2020 08:30 PM  Patient Name:  [name], [name]  MRN:  [id number]  Account #:  [id number]  Location: 2A 1E/2A150-2A1501  Date of Birth:  July 20, 1945  ------------------------------------------------------------------------------  ------------------------------------------------------------------------  Placement Information  ------------------------------------------------------------------------------  ------------------------------------------------------------------------  Referral Type: Home Health Care Services - New                                   Referral ID: [id number]  Provider Name: [facility]  Address 1: [address]                                                       Phone Number: [phone]  Address 2:                                                                       Fax Number: [phone]  City: [facility]                                                                      Selection Factors: Patient/Family Choice  State: [place]

## 2020-08-19 NOTE — Plan of Care (Signed)
Problem: Falls - Risk of:  Goal: Will remain free from falls  Description: Will remain free from falls  08/19/2020 0556 by Elpidio Anis, RN  Outcome: Met This Shift  08/18/2020 1736 by Mellody Memos, RN  Outcome: Met This Shift  Goal: Absence of physical injury  Description: Absence of physical injury  08/19/2020 0556 by Elpidio Anis, RN  Outcome: Met This Shift  08/18/2020 1736 by Mellody Memos, RN  Outcome: Met This Shift     Problem: Musculor/Skeletal Functional Status  Goal: Highest potential functional level  Outcome: Met This Shift  Goal: Absence of falls  Outcome: Met This Shift     Problem: Pain:  Goal: Pain level will decrease  Description: Pain level will decrease  08/19/2020 0556 by Elpidio Anis, RN  Outcome: Met This Shift  08/18/2020 1736 by Mellody Memos, RN  Outcome: Met This Shift  Goal: Control of acute pain  Description: Control of acute pain  Outcome: Met This Shift  Goal: Control of chronic pain  Description: Control of chronic pain  Outcome: Met This Shift

## 2020-08-19 NOTE — Care Coordination-Inpatient (Unsigned)
Patient Choice     Patient Name:  [name], [name]  MRN:  [id number]  Account #:  [id number]  Date of Birth:  1946/04/13

## 2020-08-19 NOTE — Discharge Summary (Signed)
Physician Discharge Summary     Patient ID:  [name]  [id number]  75 y.o.  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  He feels that this is increasing the effects of her eliquis so this was stopped.  She has been in NSR.  Her sugars were also elevated over 500 whenshe came in .  She is not compliant with her diet or meds at home.  Her ssi was adjusted and her sugars are much better her white count is down to 60 today.  She is being dc'd home today    Consults: hematology/oncology    Significant Diagnostic Studies: labs:     Treatments: as above    Discharge Exam:  BP (!) 157/68    Pulse 72    Temp 98.8 ??F (37.1 ??C) (Temporal)    Resp 18    Ht 5\' 5"  (1.651 m)    SpO2 100%    BMI 31.62 kg/m??   General appearance: alert, appears stated age and cooperative  Lungs: clear to auscultation bilaterally  Heart: regular rate and rhythm, S1, S2 normal, no murmur, click, rub or gallop  Abdomen: soft, non-tender; bowel sounds normal; no masses,  no organomegaly  Extremities: extremities normal, atraumatic, no cyanosis or edema    Disposition: home    Patient Instructions:   @MEDDISCHARGE @  Activity: activity as tolerated  Diet: diabetic diet  Wound Care: none needed    Follow-up with dr in one week     Signed:  ,  DO  08/19/2020  11:29 AM

## 2020-08-19 NOTE — Progress Notes (Signed)
Pt has a good understanding of discharge instrutions. Pt was educated on don't drive when taking her pain medication.

## 2020-08-19 NOTE — Care Coordination-Inpatient (Signed)
Length of Stay: 1           Anticipated date of discharge: Pending medical stability.    Medical Plan of Care: Admitted with Hyperglycemia, newly dx Leukemia, Heme/Onc. Following.    DC Disposition: Home with home care: home care liaison following, lives independently alone.    Discharge Barriers: No   Electronically signed by Lowella Bandy, RN on 08/19/20 at 8:22 AM EDT

## 2020-09-16 ENCOUNTER — Inpatient Hospital Stay: Admit: 2020-09-16 | Attending: Hematology & Oncology | Primary: Family Medicine

## 2020-09-16 NOTE — Other (Unsigned)
Patient Acct Nbr: [id number]   Primary AUTH/CERT:   Primary Insurance Company Name: Company secretary Plan name: [facility] Medicare  Primary Insurance Group Number: [id number]  Primary Insurance Plan Type: Health  Primary Insurance Policy Number: [id number]    Secondary AUTH/CERT:   Secondary Insurance Company Name: [employer]  Secondary Insurance Plan name: Medicaid  Secondary Insurance Group Number: [employer]  Secondary Insurance Plan Type: Health  Secondary Insurance Policy Number: [id number]

## 2020-09-25 ENCOUNTER — Inpatient Hospital Stay: Attending: Family Medicine | Primary: Family Medicine

## 2020-12-05 ENCOUNTER — Inpatient Hospital Stay: Attending: Family Medicine | Primary: Family Medicine

## 2020-12-05 DIAGNOSIS — R600 Localized edema: Secondary | ICD-10-CM

## 2020-12-05 NOTE — Other (Unsigned)
Patient Acct Nbr: [id number]   Primary AUTH/CERT:   Primary Insurance Company Name: Company secretary Plan name: [facility] Medicare  Primary Insurance Group Number: [id number]  Primary Insurance Plan Type: Health  Primary Insurance Policy Number: [id number]    Secondary AUTH/CERT:   Secondary Insurance Company Name: [employer]  Secondary Insurance Plan name: Medicaid  Secondary Insurance Group Number: [employer]  Secondary Insurance Plan Type: Health  Secondary Insurance Policy Number: [id number]

## 2020-12-18 NOTE — Other (Unsigned)
Patient Acct Nbr: [id number]   Primary AUTH/CERT:   Primary Insurance Company Name: Company secretary Plan name: [facility] Medicare  Primary Insurance Group Number: [id number]  Primary Insurance Plan Type: Health  Primary Insurance Policy Number: [id number]    Secondary AUTH/CERT:   Secondary Insurance Company Name: [employer]  Secondary Insurance Plan name: Medicaid  Secondary Insurance Group Number: [employer]  Secondary Insurance Plan Type: Health  Secondary Insurance Policy Number: [id number]

## 2021-01-02 ENCOUNTER — Inpatient Hospital Stay
Admit: 2021-01-02 | Discharge: 2021-01-02 | Disposition: A | Discharge status: Home / Self Care | Attending: Emergency Medicine

## 2021-01-02 DIAGNOSIS — T464X5A Adverse effect of angiotensin-converting-enzyme inhibitors, initial encounter: Secondary | ICD-10-CM

## 2021-01-02 MED ORDER — LOSARTAN POTASSIUM 100 MG PO TABS
100 MG | ORAL_TABLET | Freq: Every day | ORAL | 0 refills | Status: AC
Start: 2021-01-02 — End: ?

## 2021-01-02 NOTE — Other (Unsigned)
Patient Acct Nbr: [id number]   Primary AUTH/CERT:   Primary Insurance Company Name: Company secretary Plan name: [facility] Medicare  Primary Insurance Group Number: [id number]  Primary Insurance Plan Type: Health  Primary Insurance Policy Number: [id number]    Secondary AUTH/CERT:   Secondary Insurance Company Name: [employer]  Secondary Insurance Plan name: Medicaid  Secondary Insurance Group Number: [employer]  Secondary Insurance Plan Type: Health  Secondary Insurance Policy Number: [id number]

## 2021-01-02 NOTE — ED Notes (Signed)
Pt arrived by means of [facility] and roomed in ED 14. Pt's sister arrived in ED during this time. Pt liaison introduced self and role/availability providing hospitality, information and supportive presence. Pt liaison informed Pt/bedside nurse of sisters arrival then guided family through ED waiting area to Pt's room at this time.      [name] [name]  01/02/21 1508

## 2021-01-02 NOTE — ED Provider Notes (Signed)
[facility] ED  EMERGENCY DEPARTMENT ENCOUNTER      Pt Name: [name]  MRN: [id number]  Birthdate 07-19-1945  Date of evaluation: 01/02/2021  Provider: Worthy Rancher, MD     CHIEF COMPLAINT       Chief Complaint   Patient presents with    Angioedema         HISTORY OF PRESENT ILLNESS   (Location/Symptom, Timing/Onset, Context/Setting, Quality, Duration, Modifying Factors, Severity) Note limiting factors.   I wore a [employer], maintenance.      HPI    [name] is a 75 y.o. female who presents to the emergency department with tongue swelling for 1 day.    Patient notes that the right side of her tongue felt crampy and.  It became swollen and she had trouble talking.  911 was called.  They give her 25 mg of Benadryl.  They called me as bed control, asked about epinephrine and we decided to hold off.    Here in the ED on my exam at 3:30 PM she said her tongue symptoms are most completely resolved.  Says she is talking more like normal.  She never had any lip or throat swelling.  Never had trouble breathing or swallowing.  She is never had this before.  Medication reviewed, patient confirms that she is on lisinopril.    Nursing Notes were reviewed.    REVIEW OF SYSTEMS    (2+ for level 4; 10+ for level 5)   Review of Systems   HENT:          Tongue swelling   Respiratory:  Negative for shortness of breath and stridor.    Skin:  Negative for rash.   Hematological:         History of leukemia     PAST MEDICAL HISTORY     Past Medical History:   Diagnosis Date    Anxiety     Cancer (HCC)     Depression     Diabetes mellitus (HCC)     Heart murmur     Hyperlipidemia     Hypertension     Leukemia (HCC)     Thyroid disease        SURGICAL HISTORY       Past Surgical History:   Procedure Laterality Date    BACK SURGERY      CT BIOPSY PERCUTANEOUS DEEP BONE  07/24/2020    CT BIOPSY PERCUTANEOUS DEEP BONE    HYSTERECTOMY (CERVIX STATUS UNKNOWN)         CURRENT MEDICATIONS       Previous Medications     ALLOPURINOL (ZYLOPRIM) 300 MG TABLET    Take 300 mg by mouth at bedtime    AMLODIPINE (NORVASC) 5 MG TABLET    Take 5 mg by mouth daily    ATORVASTATIN (LIPITOR) 80 MG TABLET    take 1 tablet by mouth once daily    CYCLOBENZAPRINE (FLEXERIL) 10 MG TABLET    Take 10 mg by mouth 3 times daily as needed for Muscle spasms    FLUTICASONE (FLONASE) 50 MCG/ACT NASAL SPRAY    instill 1 spray into each nostril daily    GABAPENTIN (NEURONTIN) 100 MG CAPSULE    Take 100 mg by mouth daily.     GLIMEPIRIDE (AMARYL) 2 MG TABLET    Take 2 mg by mouth every morning (before breakfast)    IMATINIB (GLEEVEC) 400 MG CHEMO TABLET    Take 400  mg by mouth at bedtime    INSULIN LISPRO (HUMALOG) 100 UNIT/ML INJECTION VIAL    Inject 0-18 Units into the skin 3 times daily (with meals)    INSULIN LISPRO (HUMALOG) 100 UNIT/ML INJECTION VIAL    Inject 0-9 Units into the skin nightly    LEVOTHYROXINE (SYNTHROID) 88 MCG TABLET    Take 1 tablet by mouth Daily    METOPROLOL TARTRATE (LOPRESSOR) 25 MG TABLET    Take 25 mg by mouth 2 times daily    OXYCODONE 5 MG CAPSULE    Take 5 mg by mouth every 4 hours as needed for Pain.    SEMAGLUTIDE,0.25 OR 0.5MG /DOS, (OZEMPIC, 0.25 OR 0.5 MG/DOSE,) 2 MG/1.5ML SOPN    Inject 0.25 mg into the skin once a week    SERTRALINE (ZOLOFT) 100 MG TABLET    Take 100 mg by mouth daily    SITAGLIPTIN (JANUVIA) 100 MG TABLET    Take 50 mg by mouth daily    TRESIBA FLEXTOUCH 100 UNIT/ML SOPN    inject 60 units subcutaneously at bedtime       ALLERGIES     Codeine    FAMILY HISTORY       Family History   Problem Relation Age of Onset    Stroke Mother     Diabetes Father     Heart Disease Father         SOCIAL HISTORY       Social History     Socioeconomic History    Marital status: Divorced     Spouse name: None    Number of children: None    Years of education: None    Highest education level: None   Tobacco Use    Smoking status: Never    Smokeless tobacco: Never   Vaping Use    Vaping Use: Never used   Substance and  Sexual Activity    Alcohol use: No    Drug use: No       SCREENINGS    Glasgow Coma Scale  Eye Opening: Spontaneous  Best Verbal Response: Oriented  Best Motor Response: Obeys commands  Glasgow Coma Scale Score: 15      PHYSICAL EXAM    (up to 7 for level 4, 8 or more for level 5)     ED Triage Vitals [01/02/21 1458]   BP Temp Temp Source Heart Rate Resp SpO2 Height Weight   (!) 156/72 98.2 ??F (36.8 ??C) Oral 58 18 100 % 5\' 5"  (1.651 m) 175 lb (79.4 kg)       Physical Exam  Constitutional:       General: She is not in acute distress.     Appearance: She is not ill-appearing.   HENT:      Head: Normocephalic and atraumatic.      Mouth/Throat:      Comments: There is very mild swelling of the lateral aspect of the tongue, right side.  Patient speaks with a normal voice.  There is no lip or soft palate swelling.  Handling her own secretions.  Pulmonary:      Effort: Pulmonary effort is normal.      Breath sounds: Normal breath sounds. No stridor. No wheezing or rales.   Musculoskeletal:      Cervical back: Neck supple.   Skin:     General: Skin is warm and dry.   Neurological:      General: No focal deficit present.      Mental  Status: She is alert and oriented to person, place, and time.       DIAGNOSTIC RESULTS     EKG (Per Emergency Physician):   None    RADIOLOGY (Per Emergency Physician):   None    Interpretation per the Radiologist below, if available at the time of this note:  No results found.    ED BEDSIDE ULTRASOUND:   Performed by ED Physician - none    LABS:  Labs Reviewed - No data to display     All other labs were within normal range or not returned as of this dictation.    EMERGENCY DEPARTMENT COURSE and DIFFERENTIAL DIAGNOSIS/MDM:   Vitals:    Vitals:    01/02/21 1458   BP: (!) 156/72   Pulse: 58   Resp: 18   Temp: 98.2 ??F (36.8 ??C)   TempSrc: Oral   SpO2: 100%   Weight: 79.4 kg (175 lb)   Height: 5\' 5"  (1.651 m)       Medications - No data to display      MDM  .  Presentation consistent with ACE  inhibitor induced angioedema.  I discussed this with patient's PCP on the telephone.  We agreed to stop the lisinopril and start her on losartan instead.  We will observe in the ED to make sure no progression of symptoms.  Symptoms currently are very mild.    REVAL:     ED Course as of 01/02/21 1650   Thu Jan 02, 2021   1648 On my reexam at 4:45 PM, patient continues to improve and feels back to normal.  Essentially no tongue swelling on my repeat exam.  I discussed the case with her PCP and we agreed to switch her to losartan 100 mg.  Patient knows to stop the lisinopril and listed as an allergy from now on. [JK]      ED Course User Index  [JK] Jan 04, 2021, MD         CRITICAL CARE TIME   Total Critical Care time was 0 minutes, excluding separately reportable procedures.  There was a high probability of clinically significant/life threatening deterioration in the patient's condition which required my urgent intervention.     CONSULTS:  None    PROCEDURES:  Unless otherwise noted below, none     Procedures    FINAL IMPRESSION      1. Angiotensin converting enzyme inhibitor-aggravated angioedema, initial encounter          DISPOSITION/PLAN   DISPOSITION Decision To Discharge 01/02/2021 04:49:07 PM      PATIENT REFERRED TO:  03/04/2021, DO  [address]  [address]  [place]  [phone]    In 1 month      DISCHARGE MEDICATIONS:  New Prescriptions    LOSARTAN (COZAAR) 100 MG TABLET    Take 1 tablet by mouth in the morning.          (Please note:  Portions of this note were completed with a voice recognition program.  Efforts were made to edit the dictations but occasionally words and phrases are mis-transcribed.)  Form [id number]        [phone], MD  01/02/21 1650

## 2021-01-02 NOTE — ED Triage Notes (Signed)
Arrived via ems from home for co angioedema, ems gave 25mg  IV benadryl, effective to reduce swelling of tongue, patient speaking in full sentences and can provide own medical hx, A/0 x4, GCS 15, placed on monitor, continue to assess

## 2021-01-02 NOTE — Discharge Instructions (Addendum)
Stop the lisinopril and list as an allergy from now on.    Return to ER for any worsening of symptoms

## 2023-10-24 DEATH — deceased
